# Patient Record
Sex: Female | Born: 1980 | Race: White | Hispanic: Yes | Marital: Single | Smoking: Never smoker
Health system: Southern US, Community
[De-identification: ages and names within clinical notes are randomized; demographics above are authoritative.]

## PROBLEM LIST (undated history)

## (undated) DIAGNOSIS — G510 Bell's palsy: Secondary | ICD-10-CM

## (undated) DIAGNOSIS — Z9109 Other allergy status, other than to drugs and biological substances: Secondary | ICD-10-CM

## (undated) DIAGNOSIS — N979 Female infertility, unspecified: Secondary | ICD-10-CM

## (undated) HISTORY — DX: Other allergy status, other than to drugs and biological substances: Z91.09

## (undated) HISTORY — DX: Bell's palsy: G51.0

## (undated) HISTORY — PX: OTHER SURGICAL HISTORY: SHX169

## (undated) HISTORY — DX: Female infertility, unspecified: N97.9

---

## 2009-04-30 DIAGNOSIS — G510 Bell's palsy: Secondary | ICD-10-CM

## 2009-04-30 HISTORY — DX: Bell's palsy: G51.0

## 2010-05-05 ENCOUNTER — Emergency Department (HOSPITAL_COMMUNITY)
Admission: EM | Admit: 2010-05-05 | Discharge: 2010-05-05 | Payer: Self-pay | Source: Home / Self Care | Admitting: Emergency Medicine

## 2010-05-05 LAB — GLUCOSE, CAPILLARY: Glucose-Capillary: 88 mg/dL (ref 70–99)

## 2010-05-16 ENCOUNTER — Ambulatory Visit: Admit: 2010-05-16 | Payer: Self-pay | Admitting: Infectious Diseases

## 2011-08-15 ENCOUNTER — Encounter: Payer: Self-pay | Admitting: Family Medicine

## 2011-09-12 ENCOUNTER — Ambulatory Visit (INDEPENDENT_AMBULATORY_CARE_PROVIDER_SITE_OTHER): Payer: Self-pay | Admitting: Family Medicine

## 2011-09-12 ENCOUNTER — Encounter: Payer: Self-pay | Admitting: Family Medicine

## 2011-09-12 VITALS — BP 118/79 | HR 90 | Ht 65.5 in | Wt 141.0 lb

## 2011-09-12 DIAGNOSIS — N946 Dysmenorrhea, unspecified: Secondary | ICD-10-CM

## 2011-09-12 DIAGNOSIS — Z0000 Encounter for general adult medical examination without abnormal findings: Secondary | ICD-10-CM

## 2011-09-12 DIAGNOSIS — Z01419 Encounter for gynecological examination (general) (routine) without abnormal findings: Secondary | ICD-10-CM | POA: Insufficient documentation

## 2011-09-12 NOTE — Progress Notes (Signed)
S: Pt comes in today for new patient appointment.  She is applying for the orange card.  *Visit done with assistance of an interpreter*  Past medical, surgical, family, social histories updated as documented above.  Specific concerns today:  Trying to get pregnant, menstrual cramps  CRAMPS and ?INFERTILITY Cramping is usually 1st and 2nd day of cycle; motrin seems to help but they are very uncomfortable, just wants to make sure this can be normal.  Periods are not particularly heavy- last 4-5 days, occasionally has 1-2 clots.  Regular-- happen every 26-28 days.  Has been using no protection during sexual intercourse with her boyfriend for ~6 months.  Has been in this relationship x1 year (was using interruption method for birth control prior to 6 months ago).  Has never been on OCPs or other hormone therapy.  Neither her nor her boyfriend have any children.  She has not had any miscarriages or abdominal surgeries.  No family history of infertility.     ROS: Per HPI  History  Smoking status  . Not on file  Smokeless tobacco  . Not on file    O:  Filed Vitals:   09/12/11 0924  BP: 118/79  Pulse: 90    Gen: NAD, spanish speaking CV: RRR, no murmur Pulm: CTA bilat, no wheezes or crackles Abd: soft, NT Ext: Warm, no chronic skin changes, no edema   A/P: 31 y.o. female presents as new pt -See problem list -f/u once orange card established

## 2011-09-12 NOTE — Assessment & Plan Note (Signed)
Trying to get pregnant x6 months, will wait to do any baseline testing or pap until orange card approval.

## 2011-09-12 NOTE — Assessment & Plan Note (Signed)
Rec'd motrin, tylenol, hot showers, heating pad.  Only lasts 1-2 days at onset of cycle.  No red flags.

## 2011-09-12 NOTE — Patient Instructions (Signed)
Return once you have met with Debbie Campos

## 2011-11-02 ENCOUNTER — Ambulatory Visit (INDEPENDENT_AMBULATORY_CARE_PROVIDER_SITE_OTHER): Payer: Self-pay | Admitting: Sports Medicine

## 2011-11-02 ENCOUNTER — Encounter: Payer: Self-pay | Admitting: Family Medicine

## 2011-11-02 VITALS — BP 122/79 | HR 79 | Ht 65.5 in | Wt 138.4 lb

## 2011-11-02 DIAGNOSIS — M999 Biomechanical lesion, unspecified: Secondary | ICD-10-CM

## 2011-11-02 DIAGNOSIS — M533 Sacrococcygeal disorders, not elsewhere classified: Secondary | ICD-10-CM

## 2011-11-02 DIAGNOSIS — N946 Dysmenorrhea, unspecified: Secondary | ICD-10-CM

## 2011-11-02 MED ORDER — CYCLOBENZAPRINE HCL 10 MG PO TABS: 10.0000 mg | ORAL_TABLET | Freq: Every evening | ORAL | Status: AC | PRN

## 2011-11-02 MED ORDER — NAPROXEN 500 MG PO TABS: 500.0000 mg | ORAL_TABLET | Freq: Two times a day (BID) | ORAL | Status: AC

## 2011-11-02 NOTE — Patient Instructions (Addendum)
It was nice to meet you today.  You can take Aleeve twice daily to help with your back pain.   I have also sent in a prescription for Flexeril to be taken at night to help with your pain.  Continue using your icy hot patch as needed.  Follow up with Dr. Fara Boros later next week for a complete physical and for PAP smear.  Please see your dentist to have your tooth better assessed.  If you have fevers, chills, or vomiting please call our clinic to be re-evaluated sooner.  Me dio mucho gusto conocerte hoy Usted puede tomar [employer] veces al dia para ayudarla con su dolor de espalda. Ya envie Flexeril a la farmacia para que la tome por la noche par [employer] ayude con su dolor. Continue usando el parche de [employer] cada que lo necesite. Venga a su cita con la doctora Dr. Fara Boros para el papanicolau Vea el dentista para que le revise el diente. You tu tienes fiebre, escalofrios, o vomito, para que la revisen de [place]

## 2011-11-02 NOTE — Progress Notes (Signed)
Interpreter Marimar Suber Namihira for Dr Rigby 

## 2011-11-03 DIAGNOSIS — M999 Biomechanical lesion, unspecified: Secondary | ICD-10-CM | POA: Insufficient documentation

## 2011-11-03 DIAGNOSIS — M533 Sacrococcygeal disorders, not elsewhere classified: Secondary | ICD-10-CM | POA: Insufficient documentation

## 2011-11-03 NOTE — Assessment & Plan Note (Addendum)
Prescription for Naprosyn to followup with Dr. Fara Boros.  No red flags

## 2011-11-03 NOTE — Assessment & Plan Note (Signed)
Patient prescribed, naproxen and Flexeril.  As significant lumbar and pelvic girdle spasms.  Likely associated with premenstrual syndrome.  Continue medical as well as exercise treatment.  Exercises provided

## 2011-11-03 NOTE — Progress Notes (Signed)
  [facility]  Patient name: Debbie Campos MRN [id number]  Date of birth: 04-Aug-1980  Interpreter present for this encounter  CC & HPI  Jacqeline Broers is a 31 y.o. female presenting today for evaluation of R hip and R lumbar pain.  Location  right hip and right SI, region   Onset  partially 8 years ago, has been having intermittent problems at that time.  Acutely began approximately 2-3 days ago   Character  tightness crampiness.    Severity  6/10    Temporal  worse whenever she is closer to her menstrual period.  Which is approximated is awake   Alleviating  ICY hot pad typically helps.  This time is different has not relieved as much   Aggrivating  any type of movement    Has been seen by Dr. Fara Boros, and is going to be seeing her shortly for Pap smear as well as further PCP care.  Has had dysmenorrhea for many years, as well as premenstrual and menstrual cramps.  This is typically associated with this current back pain.  She is having harvested much more severe.    ROS  No other problems reported.  No fevers, no chills.  Patient is tearful and reports she is been very emotional especially her around the time of her period.  Is fearful of medical diagnoses, because she has had a significant Bell's palsy that she gets concerned about on occasion  Pertinent History Reviewed  Medical & Surgical Hx:  Reviewed: Significant for menstrual cramps, Bell's palsy Medications: Reviewed & Updated - see associated section Social History: Reviewed - Significant for nonsmoker  Objective Findings  Vitals:  Filed Vitals:   11/02/11 1403  BP: 122/79  Pulse: 79    PE: GENERAL:  Adult hispanic female.  Examined in [facility].  In moderate discomfort; norespiratory distress.   PSYCH: Alert and appropriately interactive; Insight:Good   H&N: AT/Home, MMM, no scleral icterus, EOMi THORAX: HEART: RRR, S1/S2 heard, no murmur LUNGS: CTA B, no wheezes, no  crackles EXTREMITIES: Moves all 4 extremities spontaneously, warm well perfused, 0 edema, bilateral DP and PT pulses 2/4.  Neurologic: LE DTRs 2+ out of 4, lower extremity myotomes intact 5 out of 5 diffusely OSTEOPATHIC:   Standing Flexion Test: right            Seated Flexion/ASIS CompressionTest: right Leg Length screening: R short leg LUMBAR   right upper pole L5, L4, tender points   L4 rotated right side, side bent right    SACRUM   left on left   PELVIS   right anterior

## 2011-11-03 NOTE — Assessment & Plan Note (Signed)
OMT performed to lumbar, sacral, pelvis.  Patient with significant improvement prior to completion of exam and treatment.  Patient likely benefit from further osteopathic treatment especially for premenstrual and her lumbar muscular spasm

## 2011-11-06 ENCOUNTER — Encounter: Payer: Self-pay | Admitting: Family Medicine

## 2011-11-16 ENCOUNTER — Encounter: Payer: Self-pay | Admitting: Family Medicine

## 2011-11-21 ENCOUNTER — Ambulatory Visit (INDEPENDENT_AMBULATORY_CARE_PROVIDER_SITE_OTHER): Payer: Self-pay | Admitting: Family Medicine

## 2011-11-21 ENCOUNTER — Encounter: Payer: Self-pay | Admitting: Family Medicine

## 2011-11-21 ENCOUNTER — Other Ambulatory Visit (HOSPITAL_COMMUNITY)
Admission: RE | Admit: 2011-11-21 | Discharge: 2011-11-21 | Disposition: A | Discharge status: Home / Self Care | Payer: Self-pay | Source: Ambulatory Visit | Attending: Family Medicine | Admitting: Family Medicine

## 2011-11-21 VITALS — BP 123/80 | HR 96 | Ht 65.5 in | Wt 136.0 lb

## 2011-11-21 DIAGNOSIS — Z0000 Encounter for general adult medical examination without abnormal findings: Secondary | ICD-10-CM

## 2011-11-21 DIAGNOSIS — N979 Female infertility, unspecified: Secondary | ICD-10-CM

## 2011-11-21 DIAGNOSIS — Z01419 Encounter for gynecological examination (general) (routine) without abnormal findings: Secondary | ICD-10-CM | POA: Insufficient documentation

## 2011-11-21 DIAGNOSIS — Z124 Encounter for screening for malignant neoplasm of cervix: Secondary | ICD-10-CM

## 2011-11-21 LAB — CBC
HCT: 37 % (ref 36.0–46.0)
Hemoglobin: 12.1 g/dL (ref 12.0–15.0)
MCH: 27.1 pg (ref 26.0–34.0)
MCHC: 32.7 g/dL (ref 30.0–36.0)
MCV: 83 fL (ref 78.0–100.0)
Platelets: 214 10*3/uL (ref 150–400)
RBC: 4.46 MIL/uL (ref 3.87–5.11)
RDW: 15.3 % (ref 11.5–15.5)
WBC: 6.8 10*3/uL (ref 4.0–10.5)

## 2011-11-21 NOTE — Assessment & Plan Note (Signed)
Pap done today.  TSH and CBC also done for infertility w/u. Pt due for Tdap as well.

## 2011-11-21 NOTE — Progress Notes (Signed)
Interpreter Debbie Campos for [facility]

## 2011-11-21 NOTE — Progress Notes (Signed)
  Subjective:     Debbie Campos is a 31 y.o. female and is here for a comprehensive physical exam. The patient reports no problems.  Thinks she has had a pap smear in the last 3 years, but would like one today.  Infertility: has not been able to get pregnant.  Has been having unprotected sex with boyfriend for 6 months. Neither has had children.  Pt reports regular periods, every 26-27 days lasting for 4-5 days with normal amount of bleeding (no clots, not soaking through pads). Does have significant cramping the first 1-2 days of cycle.  Has used ovulation strips once- was positive for 2 days.  Is only able to have intercourse on the weekends.  LMP 11/05/10, thinks she ovulated 11 days after first day of period.   History   Social History  . Marital Status: Married    Spouse Name: N/A    Number of Children: N/A  . Years of Education: N/A   Occupational History  . Not on file.   Social History Main Topics  . Smoking status: Never Smoker   . Smokeless tobacco: Not on file  . Alcohol Use: No  . Drug Use: No  . Sexually Active: Yes    Birth Control/ Protection: None     trying to get pregnant   Other Topics Concern  . Not on file   Social History Narrative   Works part timeHeard about [place] from a friendPrimary language is spanish    Health Maintenance  Topic Date Due  . Pap Smear  10/07/1996  . Tetanus/tdap  10/07/1997  . Influenza Vaccine  01/29/2012    The following portions of the patient's history were reviewed and updated as appropriate: allergies, current medications, past family history, past medical history, past social history, past surgical history and problem list.  Review of Systems A comprehensive review of systems was negative.   Objective:    BP 123/80  Pulse 96  Ht 5' 5.5" (1.664 m)  Wt 136 lb (61.689 kg)  BMI 22.29 kg/m2  LMP 10/09/2011 General appearance: alert, cooperative, appears stated age and no distress Head: Normocephalic, without  obvious abnormality, atraumatic Eyes: conjunctivae/corneas clear. PERRL, EOM's intact. Fundi benign. Ears: normal TM's and external ear canals both ears Nose: Nares normal. Septum midline. Mucosa normal. No drainage or sinus tenderness. Throat: lips, mucosa, and tongue normal; teeth and gums normal Neck: no adenopathy, supple, symmetrical, trachea midline and thyroid not enlarged, symmetric, no tenderness/mass/nodules Lungs: clear to auscultation bilaterally Heart: regular rate and rhythm, S1, S2 normal, no murmur, click, rub or gallop Abdomen: soft, non-tender; bowel sounds normal; no masses,  no organomegaly Pelvic: cervix normal in appearance, external genitalia normal, no adnexal masses or tenderness, no cervical motion tenderness, rectovaginal septum normal, uterus normal size, shape, and consistency and vagina normal without discharge Extremities: extremities normal, atraumatic, no cyanosis or edema Skin: Skin color, texture, turgor normal. No rashes or lesions Neurologic: Grossly normal    Assessment:    Healthy female exam. Concerns about infertility.     Plan:     See After Visit Summary for Counseling Recommendations

## 2011-11-21 NOTE — Assessment & Plan Note (Signed)
Technically since pt is <33 yo, does not qualify for dx of infertility (if <33yo, >1 year of non-conception; if >33yo, only need 6 months).  Most likely cause is lack of ability to have intercourse when ovulating due to boyfriend working out of town and only being home on weekends.  Pt has had + ovulatory test strips.  Encouraged her to continue these.  Next step would be semen analysis of female.  Patient with discuss with partner about his willingness to undergo this process.

## 2011-11-21 NOTE — Patient Instructions (Signed)
Gusto en verla hoy, Le hicimos su papanicolau. Le checamos la sangre paa buscar la razon porque no se embaraza. Creo que Zenaida Niece a Nature conservation officer. Lo siguiente es hablar con su pareja sobre un analisis de semen. Esto es dar un [place] de su semen para Public librarian. Siga unsando las tiras para ver si sigue ovulando, y para que sepa cuando es el mejor momento de Child psychotherapist. si decide hacerlo aviseme y yo le ayudo a coordiarlo.  Infertilidad (Infertility) QU ES LA INFERTILIDAD?  La infertilidad normalmente se define como la incapacidad para quedar embarazada luego de un ao de relaciones sexuales regulares sin la utilizacin de mtodos anticonceptivos. O la incapacidad de llevar a trmino Chartered loss adjuster y Warehouse manager el beb. La tasa de infertilidad en los [place] es de alrededor del 10%. El [address] es el resultado de una cadena de sucesos. La mujer debe liberar el vulo de uno de sus ovarios (ovulacin). El vulo debe fertilizarse con el esperma. Luego viaja a travs de las trompas de Falopio hacia el tero (matriz), donde se une a la pared del tero y crece. El hombre debe tener suficiente esperma y el esperma debe unirse con el vulo (fertilizar) en el momento justo. El vulo fertilizado debe luego unirse al interior del tero. Esto parece simple, pero pueden ocurrir [employer] cosas que evitan que el embarazo se produzca.  DE QUIN ES EL PROBLEMA?  Un 20% de los casos de infertilidad se deben a problemas del hombres (factores masculinos) y un 65% se debe a problemas de la mujer (factores femeninos). Otras causas pueden ser [place] combinacin de factores masculinos y femeninos o a causas desconocidas.  CULES SON LAS CAUSAS DE LA INFERTILIDAD EN EL HOMBRE?  La infertilidad en el hombre a menudo se debe a problemas para producir el esperma o hacer que el mismo llegue al vulo. Los problemas de esperma pueden existir desde el nacimiento o desarrollarse ms tarde debido a enfermedades o lesiones. Algunos  hombres no producen esperma, o producen muy poco (oligospermia). Entre otros problemas se incluyen:  Disfuncin sexual   Problemas hormonales o endocrinos.   La edad. La fertilidad del hombre disminuye con la edad, pero no tan temprano como la femenina.   Infecciones.   Problemas congnitos Defectos de nacimiento, como ausencia de los tubos que transportan el esperma (conductos deferentes).   Problemas genticos o de cromosomas.   Problemas de anticuerpos antiesperma.   Eyaculacin retrgrada (el esperma va hacia la vejiga).   Varicoceles, espermatoceles o tumores en los testculos.   El estilo de vida puede influir en el nmero y la calidad del esperma del hombre.   El alcohol y las drogas pueden reducir temporalmente la calidad del esperma.   Las toxinas [address], como los pesticidas y el plomo, pueden ocasionar algunos casos de infertilidad en hombres.  CULES SON LAS CAUSAS DE LA INFERTILIDAD EN LA MUJER?   La infertilidad en las mujeres est causada mayormente por problemas con la ovulacin. Sin ovulacin, el vulo no puede fertilizarse.   Seales de problemas de ovulacin son perodos menstruales irregulares o ningn perodo.   Factores simples del estilo de vida, como el estrs, la dieta, o el entrenamiento deportivo, pueden afectar el balance hormonal de Medical laboratory scientific officer.   La edad. La fertilidad comienza a IT consultant alrededor de los 30 aos y [place] a Glass blower/designer de los 37.   Mucho menos a menudo, un desequilibrio hormonal por un problema mdico serio como un tumor en la glndula  pituitaria, tiroides u otra enfermedad mdica crnica pueden ocasionar problemas de ovulacin.   Infecciones plvicas.   Sndrome de ovarios poliqusticos (aumento de hormonas masculinas, incapacidad de ovular).   Consumo de alcohol o drogas.   Toxinas ambientales, radiacin, pesticidas y ciertos qumicos.   La edad es un factor importante en la infertilidad femenina.   La  capacidad de los ovarios de una mujer para producir vulos disminuye con la edad, especialmente despus de los 35 aos. Alrededor de un tercio de las parejas en las que la mujer tiene ms de 35 aos tendr problemas de infertilidad.   En el momento en el que alcanza la [place], cuando su perodo menstrual se detiene, la mujer ya no puede producir vulos ni quedar embarazada.   Otros problemas tambin pueden llevar a la infertilidad en las mujeres. Si las trompas de [employer] estn bloqueadas en [employer] extremos, el vulo no puede pasar a travs de los tubos [place]. Tejido cicatrizal (adhesiones) en la pelvis que pueden obstruir las trompas. Esto puede dar como resultado una enfermedad inflamatoria plvica, endometriosis o una ciruga por embarazo ectpico (en la que el vulo fertilizado se ha implantado fuera del tero) o cualquier ciruga plvica o abdominal que ocasione adherencias.   Tumores fibroides o plipos en el tero.   Anormalidades congnitas (de nacimiento) del tero.   Infecciones en el cuello del tero (cervicitis).   Estenosis cervical (estrechamiento).   Mucosidad cervical anormal.   Sndrome poliqustico de los ovarios.   Tener relaciones sexuales muy seguidas (todos [place] o 4 a 5 veces por semana).   Obesidad.   Anorexia.   Dficit nutricional.   Mucho ejercicio, con prdida de grasa corporal.   DES. Su madre ha recibido la hormona dietilstilbesterol cuando estaba embarazada de usted.  CMO SE ANALIZA LA INFERTILIDAD?  Si ha estado tratando de quedar embarazada sin xito, podra querer buscar ayuda mdica. No debera esperar un ao de intentos sin xito antes de buscar ayuda profesional si:  Tiene mas de 35 aos de edad   Tiene razones para creer que podra tener problemas de fertilidad.  Un examen mdico determinar las causas de infertilidad de la pareja, Normalmente el proceso comienza con:  Exmenes fsicos   Historias clnicas de ambos  miembros de la pareja.   Historiales sexuales de ambos miembros de la pareja.  Si no hay un problema evidente, como relaciones sexuales en tiempos inadecuados o ausencia de ovulacin, se necesitarn Academic librarian.   Para el hombre, los anlisis normalmente comienzan con pruebas de semen para observar:   La cantidad de esperma.   La forma del esperma.   El movimiento del esperma.   Realizar un historial clnico y quirrgico completo.   Examen fsico.   Control de infecciones en los rganos reproductivos.  Podrn realizarle anlisis hormonales.   Para la mujer, el primer paso del anlisis es saber si ovula cada mes. Hay diferentes modos de Designer, industrial/product. Por ejemplo, puede llevar un registro de los cambios en la temperatura corporal por la maana y la textura de la mucosidad cervical. Otra herramienta es un kit de prueba de ovulacin casero, que puede comprarse en la farmacia.   Tambin pueden realizarse controles de ovulacin en el consultorio mdico, mediante anlisis de sangre para observar los niveles de hormonas o pruebas de [employer] ovarios. Si la mujer est ovulando, se necesitarn realizar ms exmenes. Algunas femeninas comunes incluyen:   Histerosalpingografa: Se realizan rayos x de  las trompas de [place]opio y el tero con una inyeccin de [place]. Mostrar si las trompas estn abiertas y la forma del tero.   Laparoscopa: Un anlisis de las trompas y otros rganos femeninos para Copywriter, advertising. Se utiliza un tubo con iluminacin llamado laparoscopio para observar el interior del abdomen.   Biopsia endometrial: Se toma una muestra del tejido del tero Film/video editor del perodo menstrual, para ver si el tejido le indica si est ovulando.   Prueba de ultrasonido transvaginal: Examina los rganos femeninos.   Histeroscopa: Utiliza un tubo con iluminacin para examinar el cuello del tero y el tero y ver si hay anormalidades dentro del mismo.   TRATAMIENTO Dependiendo de los [employer], se sugerirn IT sales professional. El tratamiento depende de la causa. Entre el 85 y el 90% de los casos de infertilidad se tratan con drogas o [place].   Hay varias drogas para la fertilidad que pueden utilizarse para las mujeres con problemas de ovulacin. Es importante hablar con el profesional que la asiste sobre la droga a Chemical engineer. Deber comprender los beneficios y efectos colaterales de las drogas. Segn el tipo de droga para la fertilidad y la dosis [place], algunas mujeres podran tener embarazos mltiples (mellizos).   De ser [employer], se puede realizar una ciruga para reparar daos en los ovarios de la [place], trompas de [place], el cuello del tero o el tero.   Tratamiento quirrgico o mdico para la endometriosis o el sndrome de ovario poliqustico. A veces, los problemas de fertilidad en el hombre pueden corregirse con medicamentos o [place].   Inseminacin intrauterina, IUI, de esperma en concordancia con la ovulacin.   Cambios en el estilo de vida, si esta es la causa (prdida de [place], aumento de ejercicios, dejar de fumar, beber excesivamente o tomar drogas ilegales).   Otros tipos de ciruga:   Extirpar tumores por dentro o sobre el tero.   Eliminar tejido cicatrizal de dentro del tero.   Arreglar trompas obstruidas.   Eliminar tejido cicatrizal en la pelvis y alrededor de los rganos femeninos.  QU ES LA TECNOLOGA DE REPRODUCCIN ASISTIDA (ART)?  La tecnologa de reproduccin asistida (ART) utiliza mtodos especiales para ayudar a parejas infrtiles. En esta tcnica se manipula tanto el vulo de la mujer como el esperma del hombre. El xito depende de muchos factores. La ART puede ser cara y [address]. Pero ha hecho posible que muchas parejas tengan hijos que de otra manera no hubieran podido. A continuacin se enumeran algunos mtodos:  Fertilizacin. In Vitro (FIV). In Vitro (IVF) es  un procedimiento que se hizo famoso con el nacimiento en 1978 de [place], el primer "beb de probeta" del mundo. Se utiliza cuando las trompas de [employer] de la mujer estn bloqueadas o cuando el hombre tiene poco esperma. Se utiliza una droga para estimular los ovarios y producir mltiples vulos. Una vez maduros, los vulos se retiran y se Industrial/product designer en una placa de cultivo con el esperma del hombre para la fertilizacin. Luego de aproximadamente 40 horas, los vulos se examinan para ver si han sido fertilizados por el esperma y se han dividido en clulas. Esos vulos fertilizados (embriones) se colocan luego en el tero de la mujer. Esto evita el paso por las trompas de [place].   La transferencia intrafalopiana de gametas (GIFT) es similar al IVF, pero se utiliza cuando la mujer tiene al menos una trompa de falopio normal. Se colocan de tres a cinco vulos en la trompa  de Falopio, junto con el esperma del hombre, para que la fertilizacin se realice dentro del cuerpo de Architectural technologist.   La transferencia intrafalopiana de cigotos (ZIFT), tambin se denomina transferencia embrionaria, y [place] el IVF con el GIFT. Los vulos retirados de los ovarios de la mujer se Land laboratorio y se Industrial/product designer en las trompas de [employer] en lugar de en el tero.   Los procedimientos de ART a menudo suponen la utilizacin de donantes de vulos (de [place] mujer) o de embriones congelados previamente. Los donantes de vulos pueden utilizarse si la mujer tiene [employer] ovarios o posee una enfermedad gentica que podra transmitirla al beb.   Cuando se realiza una ART hay mayor riesgo de embarazos mltiples, mellizos, trillizos o ms.   La inyeccin de esperma intracitoplasmtica es un procedimiento que inyecta un espermatozoide en el vulo para fertilizarlo.   El trasplante embrionario es un procedimiento que comienza con un embrin que se ha desarrollado en un medio especial (solucin qumica) preparado para  mantener el embrin vivo por 2 a 5 das, y Conservation officer, historic buildings.  En los [employer] que no puede encontrarse la causa y Firefighter no se produce, podr considerarse la adopcin. Document Released: 05/06/2007 Document Revised: 04/05/2011 [facility] Patient Information 2012 [place], [place].

## 2011-11-22 ENCOUNTER — Encounter: Payer: Self-pay | Admitting: Family Medicine

## 2011-11-22 LAB — TSH: TSH: 1.479 u[IU]/mL (ref 0.350–4.500)

## 2011-11-26 ENCOUNTER — Other Ambulatory Visit (HOSPITAL_COMMUNITY)
Admission: RE | Admit: 2011-11-26 | Discharge: 2011-11-26 | Disposition: A | Discharge status: Home / Self Care | Payer: Self-pay | Source: Ambulatory Visit | Attending: Family Medicine | Admitting: Family Medicine

## 2011-11-26 ENCOUNTER — Ambulatory Visit (INDEPENDENT_AMBULATORY_CARE_PROVIDER_SITE_OTHER): Payer: Self-pay | Admitting: Family Medicine

## 2011-11-26 VITALS — BP 107/70 | HR 92 | Ht 65.5 in | Wt 137.0 lb

## 2011-11-26 DIAGNOSIS — N898 Other specified noninflammatory disorders of vagina: Secondary | ICD-10-CM | POA: Insufficient documentation

## 2011-11-26 DIAGNOSIS — Z113 Encounter for screening for infections with a predominantly sexual mode of transmission: Secondary | ICD-10-CM | POA: Insufficient documentation

## 2011-11-26 DIAGNOSIS — L293 Anogenital pruritus, unspecified: Secondary | ICD-10-CM

## 2011-11-26 LAB — POCT WET PREP (WET MOUNT): Clue Cells Wet Prep Whiff POC: NEGATIVE

## 2011-11-26 MED ORDER — CLOTRIMAZOLE 1 % VA CREA: 1.0000 | TOPICAL_CREAM | Freq: Two times a day (BID) | VAGINAL | Status: AC

## 2011-11-26 NOTE — Assessment & Plan Note (Signed)
Although wet prep negative, vaginal/vulvar erythema and irritation with thick white discharge consistent with yeast infection.  Will try clotrimazole cream (patient prefers cream versus oral medication) for 1 week.  Follow-up if persistent symptoms after 1 week.

## 2011-11-26 NOTE — Progress Notes (Signed)
  Subjective:    Patient ID: Debbie Campos, female    DOB: June 10, 1980, 31 y.o.   MRN: [id number]  HPI Work-in: vaginal discharge and irritation for 1 week; pain with intercourse The course is unchanged.  She is not taking medication for these symptoms. She denies fevers, nausea, dysuria, back pain.   Review of Systems Per HPI.     Objective:   Physical Exam GEN: NAD; well-nourished, -appearing CV: RRR PULM: NI WOB ABD: NABS, soft, NT, ND EXT: no edema GU: vulva/vagina with erythema and tenderness; cervix normal; thick white vaginal discharge with foul-smelling odor; ?cervical motion tenderness BACK: no CVA tenderness    Assessment & Plan:

## 2011-11-26 NOTE — Patient Instructions (Addendum)
Es posible que tiene una infeccion de hongo.  Use la crema contra el hongo dos veces diario para siete dias.  Si todavia tiene sintomas despues de [employer], regrese a la clinica por favor.

## 2011-11-28 ENCOUNTER — Encounter: Payer: Self-pay | Admitting: Family Medicine

## 2012-02-27 ENCOUNTER — Ambulatory Visit (INDEPENDENT_AMBULATORY_CARE_PROVIDER_SITE_OTHER): Payer: Self-pay | Admitting: Family Medicine

## 2012-02-27 ENCOUNTER — Encounter: Payer: Self-pay | Admitting: Family Medicine

## 2012-02-27 VITALS — BP 117/79 | HR 87 | Wt 136.0 lb

## 2012-02-27 DIAGNOSIS — F411 Generalized anxiety disorder: Secondary | ICD-10-CM

## 2012-02-27 DIAGNOSIS — F418 Other specified anxiety disorders: Secondary | ICD-10-CM

## 2012-02-27 MED ORDER — LORAZEPAM 1 MG PO TABS: 0.5000 mg | ORAL_TABLET | Freq: Two times a day (BID) | ORAL | Status: DC | PRN

## 2012-02-27 NOTE — Assessment & Plan Note (Addendum)
Will try low dose ativan (0.5-1mg ) BID PRN for situational anxiety (precepted with Dr. Mauricio Po).  Pt reports she usually gets better on her own after a few weeks to month.  Should do PHQ-9 at next visit to evaluate for underlying depression.  Consider starting SSRI if remains an issue. F/u 1-2 weeks for recheck.

## 2012-02-27 NOTE — Progress Notes (Addendum)
S: Pt comes in today for work in visit for neck pain and anxiety.  Patient reports feeling very nervous and scared for the past 8 days, since her uncle diet of a stroke.  Pt reports that she is just fearful in general, but does feel her heart racing when she goes to unlock her front door when she gets home.  She also gets anxious when cooking, she feels like there is someone behind her.  She had an episode 2 days ago where an oil and vinegar container moved across the counter top on its own when she and her sister were cleaning up after making a meal.  She has felt like this before, she usually becomes very scared every time someone passes.  The last time that this happened, she had anxiety for ~1 month before it went away.  Pt was feeling completely fine, without any anxiety or fear, until her uncle's death.  The last time she had this much anxiety she unfortunately also had Bell's Palsy which she attributes to being scared, and so she is worried that the Bell's Palsy will return with the return of her fear.  She is functioning well- able to cook, clean, go out of the house, stay at home by herself, etc.  She was sleeping well until last night, which was the first night she had difficulty sleeping. She is eating and drinking well.  No N/V. No chest pain or dizziness, + occasional palpitations.  Reports thinking about her uncle a lot.  Is asking for a pill to decrease her nerves and help her sleep.   GAD score: 12  ROS: Per HPI  History  Smoking status  . Never Smoker   Smokeless tobacco  . Not on file    O:  Filed Vitals:   02/27/12 1533  BP: 117/79  Pulse: 87    Gen: NAD, appropriate, good eye contact, well dressed/groomed, not tearful, linear thinking without obvious pressured speech    A/P: 31 y.o. female p/w situational anxiety -See problem list -f/u in 1-2 weeks

## 2012-02-27 NOTE — Patient Instructions (Signed)
It was nice to see you.  I'm sorry you're having so much anxiety.  I am giving you a medicine that you can take 2 times per day while you are having this fear.  Take 1/2 to 1 tablet.  Do not drink any alcohol while taking this medicine.   Do not drive after taking this medicine until you have taken it a few times.  Come back in 1-2 weeks so we can see how you are doing.     Ansiedad y crisis de [place] (Anxiety and Panic Attacks) El profesional que lo asiste le ha informado que usted padece ansiedad o crisis de [place]. Este trastorno puede presentarse de [employer]. La mayor parte de las veces las crisis aparecen de modo repentino y sin aviso. Se producen en cualquier momento del da, incluso durante el sueo, y en cualquier etapa de la vida. Pueden ser muy intensas e inexplicadas. Aunque un ataque de pnico puede ser muy atemorizante, no produce daos fsicos. Algunas veces se desconoce la causa de la ansiedad. La ansiedad es un mecanismo protector del organismo en su respuesta de lucha o escape. Muchas de estas situaciones de percepcin de peligro son en realidad situaciones no fsicas (como la ansiedad de perder el [place]). CAUSAS Las causas de la ansiedad o de un ataque de pnico pueden ser [place]. Los ataques de pnico pueden ocurrir en personas sanas en una serie de circunstancias. Es posible que haya una causa gentica de los ataques de pnico. Algunos medicamentos pueden provocar ansiedad como efecto secundario. SNTOMAS Algunas de las sensaciones ms comunes son:  Terror intenso.   Vahdos, desfallecimiento.   Golpes de fro y Airline pilot.   Temor a Estate manager/land agent.   Sentimiento de irrealidad.   Sudoracin.   Temblores.   Dolor en el pecho y latidos irregulares (palpitaciones).   Sensaciones de [employer] o sofocos.   Sentimiento de peligro inminente y de que la muerte est prxima.   Hormigueo en las extremidades que puede venir de la respiracin agitada.   Alteracin de la  realidad (desrealizacin).   Sentirse separado de uno mismo (despersonalizacin).  Estos son los sntomas (problemas) ms comunes y pueden combinarse para presentar la crisis de [place].  DIAGNSTICO La evaluacin que realice el profesional depender del tipo de sntomas que est experimentando. El diagnstico de la ansiedad o de ataque de pnico se realiza cuando no se encuentra ninguna enfermedad fsica que pueda determinarse como la causa de los sntomas. TRATAMIENTO El tratamiento para prevenir la ansiedad y los ataques de pnico incluye:  Automotive engineer las circunstancias que causen ansiedad.   Reaseguro y relajacin.   Ejercicio regular.   Terapias de relajacin, como yoga.   Psicoterapia con un psiquiatra o terapeuta.   Evitar la cafena, el alcohol y las drogas [address]   Medicamentos de prescripcin.  SOLICITE ATENCIN MDICA DE INMEDIATO SI:  Usted experimenta sntomas de ataque de pnico que son distintos a sus sntomas usuales.   Tiene cualquier sntoma que empeora o lo preocupa.  Document Released: 04/16/2005 Document Revised: 04/05/2011 [facility] Patient Information 2012 [place], [place].

## 2012-02-27 NOTE — Progress Notes (Signed)
Interpreter Yacoub Diltz Namihira for Dr Mc Gill 

## 2012-04-03 ENCOUNTER — Encounter: Payer: Self-pay | Admitting: Family Medicine

## 2012-04-03 ENCOUNTER — Ambulatory Visit (INDEPENDENT_AMBULATORY_CARE_PROVIDER_SITE_OTHER): Payer: No Typology Code available for payment source | Admitting: Family Medicine

## 2012-04-03 VITALS — BP 115/72 | HR 100 | Ht 65.5 in | Wt 139.6 lb

## 2012-04-03 DIAGNOSIS — N979 Female infertility, unspecified: Secondary | ICD-10-CM

## 2012-04-03 DIAGNOSIS — F411 Generalized anxiety disorder: Secondary | ICD-10-CM

## 2012-04-03 DIAGNOSIS — F418 Other specified anxiety disorders: Secondary | ICD-10-CM

## 2012-04-03 NOTE — Assessment & Plan Note (Signed)
Technically has had 1 year of unprotected intercourse without successful pregnancy, which is desired by both her and her partner.  However, frequent intercourse has only been for the past few months due to him previously having a job that caused him to be out of town during the week.  Had normal pelvic exam previously and normal TSH.  Will have partner get semen analysis.  D/w Dr. Marice Potter (on call Ob/Gyn) who was in agreement with just doing semen analysis for now.

## 2012-04-03 NOTE — Progress Notes (Signed)
S: Pt comes in today for referral to fertility clinic.  Visit done with assistance of spanish interpreter.  At this time, patient and boyfriend have been having unprotected intercourse for ~1 year without successful pregnancy.  Neither pt nor her partner have ever had any children.  At last discussion, ~6 months ago, patient and partner were only able to have intercourse on the weekends because he has a job that causes him to travel during the week.  She has had + ovulatory test strips.  She has had a normal TSH, normal pap smear, and normal physical exam.    Partner started working here for a few months.  Is having intercourse 1-2 times per day.  Still having + ovulatory test strips, but only appear to be faintly positive- usually positive for 2 days in a row.  Periods are still regular, LMP 03/19/12.    Not taking any medicines. No family history of infertility problems.   Of note, anxiety has resolved-- only needed benzo x8 days then anxiety resolved.  Feels much better, no further mood issues.    ROS: Per HPI  History  Smoking status  . Never Smoker   Smokeless tobacco  . Not on file    O:  Filed Vitals:   04/03/12 1446  BP: 115/72  Pulse: 100    Gen: NAD, pleasant   A/P: 31 y.o. female p/w desire for pregnancy -See problem list -f/u in after semen analysis

## 2012-04-03 NOTE — Patient Instructions (Signed)
It was good to see you today. I am giving you a prescription so that your partner can have a semen analysis done.   They do this at [facility]. Do not have sex for 2-7 days before having this done. Try to not have sex for a few days before you are supposed to ovulate; then have intercourse on the days that your ovulation test strips are positive.  You can bring the results of the test back to me.   Fue bueno verte hoy. Te estoy dando una receta para que su pareja no debe tener un anlisis de semen hecho. Lo hacen en el [employer]. No tenga relaciones sexuales durante 2-7 das antes de haber hecho esto. Trate de no Child psychotherapist sexuales durante unos das antes de que se supone que ovular, y Agricultural engineer relaciones sexuales en [address] que sus tiras de prueba de ovulacin son positivas.  Usted puede traer a [employer] de la prueba de [place] a m.    Infertilidad (Infertility) QU ES LA INFERTILIDAD?  La infertilidad normalmente se define como la incapacidad para quedar embarazada luego de un ao de relaciones sexuales regulares sin la utilizacin de mtodos anticonceptivos. O la incapacidad de llevar a trmino Chartered loss adjuster y Warehouse manager el beb. La tasa de infertilidad en los [place] es de alrededor del 10%. El [address] es el resultado de una cadena de sucesos. La mujer debe liberar el vulo de uno de sus ovarios (ovulacin). El vulo debe fertilizarse con el esperma. Luego viaja a travs de las trompas de Falopio hacia el tero (matriz), donde se une a la pared del tero y crece. El hombre debe tener suficiente esperma y el esperma debe unirse con el vulo (fertilizar) en el momento justo. El vulo fertilizado debe luego unirse al interior del tero. Esto parece simple, pero pueden ocurrir [employer] cosas que evitan que el embarazo se produzca.  DE QUIN ES EL PROBLEMA?  Un 20% de los casos de infertilidad se deben a problemas del hombres (factores masculinos) y un 65% se debe a  problemas de la mujer (factores femeninos). Otras causas pueden ser [place]omia Dear combinacin de factores masculinos y femeninos o a causas desconocidas.  CULES SON LAS CAUSAS DE LA INFERTILIDAD EN EL HOMBRE?  La infertilidad en el hombre a menudo se debe a problemas para producir el esperma o hacer que el mismo llegue al vulo. Los problemas de esperma pueden existir desde el nacimiento o desarrollarse ms tarde debido a enfermedades o lesiones. Algunos hombres no producen esperma, o producen muy poco (oligospermia). Entre otros problemas se incluyen:  Disfuncin sexual  Problemas hormonales o endocrinos.  La edad. La fertilidad del hombre disminuye con la edad, pero no tan temprano como la femenina.  Infecciones.  Problemas congnitos Defectos de nacimiento, como ausencia de los tubos que transportan el esperma (conductos deferentes).  Problemas genticos o de cromosomas.  Problemas de anticuerpos antiesperma.  Eyaculacin retrgrada (el esperma va hacia la vejiga).  Varicoceles, espermatoceles o tumores en los testculos.  El estilo de vida puede influir en el nmero y la calidad del esperma del hombre.  El alcohol y las drogas pueden reducir temporalmente la calidad del esperma.  Las toxinas [address], como los pesticidas y el plomo, pueden ocasionar algunos casos de infertilidad en hombres. CULES SON LAS CAUSAS DE LA INFERTILIDAD EN LA MUJER?   La infertilidad en las mujeres est causada mayormente por problemas con la ovulacin. Sin ovulacin, el vulo no puede fertilizarse.  Seales  de problemas de ovulacin son perodos menstruales irregulares o ningn perodo.  Factores simples del estilo de vida, como el estrs, la dieta, o el entrenamiento deportivo, pueden afectar el balance hormonal de Medical laboratory scientific officer.  La edad. La fertilidad comienza a IT consultant alrededor de los 30 aos y [place] a Glass blower/designer de los 37.  Mucho menos a menudo, un desequilibrio hormonal por un  problema mdico serio como un tumor en la glndula pituitaria, tiroides u otra enfermedad mdica crnica pueden ocasionar problemas de ovulacin.  Infecciones plvicas.  Sndrome de ovarios poliqusticos (aumento de hormonas masculinas, incapacidad de ovular).  Consumo de alcohol o drogas.  Toxinas ambientales, radiacin, pesticidas y ciertos qumicos.  La edad es un factor importante en la infertilidad femenina.  La capacidad de los ovarios de una mujer para producir vulos disminuye con la edad, especialmente despus de los 35 aos. Alrededor de un tercio de las parejas en las que la mujer tiene ms de 35 aos tendr problemas de infertilidad.  En el momento en el que alcanza la [place], cuando su perodo menstrual se detiene, la mujer ya no puede producir vulos ni quedar embarazada.  Otros problemas tambin pueden llevar a la infertilidad en las mujeres. Si las trompas de Nordstrom estn bloqueadas en [employer] extremos, el vulo no puede pasar a travs de los tubos [place]. Tejido cicatrizal (adhesiones) en la pelvis que pueden obstruir las trompas. Esto puede dar como resultado una enfermedad inflamatoria plvica, endometriosis o una ciruga por embarazo ectpico (en la que el vulo fertilizado se ha implantado fuera del tero) o cualquier ciruga plvica o abdominal que ocasione adherencias.  Tumores fibroides o plipos en el tero.  Anormalidades congnitas (de nacimiento) del tero.  Infecciones en el cuello del tero (cervicitis).  Estenosis cervical (estrechamiento).  Mucosidad cervical anormal.  Sndrome poliqustico de los ovarios.  Tener relaciones sexuales muy seguidas (todos [place] o 4 a 5 veces por semana).  Obesidad.  Anorexia.  Dficit nutricional.  Mucho ejercicio, con prdida de grasa corporal.  DES. Su madre ha recibido la hormona dietilstilbesterol cuando estaba embarazada de usted. CMO SE ANALIZA LA INFERTILIDAD?  Si ha estado tratando  de quedar embarazada sin xito, podra querer buscar ayuda mdica. No debera esperar un ao de intentos sin xito antes de buscar ayuda profesional si:  Tiene mas de 35 aos de edad  Tiene razones para creer que podra tener problemas de fertilidad. Un examen mdico determinar las causas de infertilidad de la pareja, Normalmente el proceso comienza con:  Exmenes fsicos  Historias clnicas de ambos miembros de la pareja.  Historiales sexuales de ambos miembros de la pareja. Si no hay un problema evidente, como relaciones sexuales en tiempos inadecuados o ausencia de ovulacin, se necesitarn Academic librarian.   Para el hombre, los anlisis normalmente comienzan con pruebas de semen para observar:  La cantidad de esperma.  La forma del esperma.  El movimiento del esperma.  Realizar un historial clnico y quirrgico completo.  Examen fsico.  Control de infecciones en los rganos reproductivos. Podrn realizarle anlisis hormonales.   Para la mujer, el primer paso del anlisis es saber si ovula cada mes. Hay diferentes modos de Designer, industrial/product. Por ejemplo, puede llevar un registro de los cambios en la temperatura corporal por la maana y la textura de la mucosidad cervical. Otra herramienta es un kit de prueba de ovulacin casero, que puede comprarse en la farmacia.  Tambin pueden realizarse controles de ovulacin  en el consultorio mdico, mediante anlisis de sangre para observar los niveles de hormonas o pruebas de [employer] ovarios. Si la mujer est ovulando, se necesitarn realizar ms exmenes. Algunas femeninas comunes incluyen:  Histerosalpingografa: Se realizan rayos x de las trompas de Falopio y el tero con una inyeccin de [place]. Mostrar si las trompas estn abiertas y la forma del tero.  Laparoscopa: Un anlisis de las trompas y otros rganos femeninos para Copywriter, advertising. Se utiliza un tubo con iluminacin llamado laparoscopio para observar  el interior del abdomen.  Biopsia endometrial: Se toma una muestra del tejido del tero Film/video editor del perodo menstrual, para ver si el tejido le indica si est ovulando.  Prueba de ultrasonido transvaginal: Examina los rganos femeninos.  Histeroscopa: Utiliza un tubo con iluminacin para examinar el cuello del tero y el tero y ver si hay anormalidades dentro del mismo. TRATAMIENTO Dependiendo de los [employer], se sugerirn IT sales professional. El tratamiento depende de la causa. Entre el 85 y el 90% de los casos de infertilidad se tratan con drogas o [place].   Hay varias drogas para la fertilidad que pueden utilizarse para las mujeres con problemas de ovulacin. Es importante hablar con el profesional que la asiste sobre la droga a Chemical engineer. Deber comprender los beneficios y efectos colaterales de las drogas. Segn el tipo de droga para la fertilidad y la dosis [place], algunas mujeres podran tener embarazos mltiples (mellizos).  De ser [employer], se puede realizar una ciruga para reparar daos en los ovarios de la [place], trompas de [place], el cuello del tero o el tero.  Tratamiento quirrgico o mdico para la endometriosis o el sndrome de ovario poliqustico. A veces, los problemas de fertilidad en el hombre pueden corregirse con medicamentos o [place].  Inseminacin intrauterina, IUI, de esperma en concordancia con la ovulacin.  Cambios en el estilo de vida, si esta es la causa (prdida de Christmas, aumento de ejercicios, dejar de fumar, beber excesivamente o tomar drogas ilegales).  Otros tipos de ciruga:  Extirpar tumores por dentro o sobre el tero.  Eliminar tejido cicatrizal de dentro del tero.  Arreglar trompas obstruidas.  Eliminar tejido cicatrizal en la pelvis y alrededor de los rganos femeninos. QU ES LA TECNOLOGA DE REPRODUCCIN ASISTIDA (ART)?  La tecnologa de reproduccin asistida (ART) utiliza mtodos especiales para ayudar a  parejas infrtiles. En esta tcnica se manipula tanto el vulo de la mujer como el esperma del hombre. El xito depende de muchos factores. La ART puede ser cara y [address]. Pero ha hecho posible que muchas parejas tengan hijos que de otra manera no hubieran podido. A continuacin se enumeran algunos mtodos:  Fertilizacin. In Vitro (FIV). In Vitro (IVF) es un procedimiento que se hizo famoso con el nacimiento en 1978 de [place], el primer "beb de probeta" del mundo. Se utiliza cuando las trompas de Nordstrom de la mujer estn bloqueadas o cuando el hombre tiene poco esperma. Se utiliza una droga para estimular los ovarios y producir mltiples vulos. Una vez maduros, los vulos se retiran y se Industrial/product designer en una placa de cultivo con el esperma del hombre para la fertilizacin. Luego de aproximadamente 40 horas, los vulos se examinan para ver si han sido fertilizados por el esperma y se han dividido en clulas. Esos vulos fertilizados (embriones) se colocan luego en el tero de la mujer. Esto evita el paso por las trompas de [place].  La transferencia intrafalopiana de gametas (GIFT) es similar  al IVF, pero se utiliza cuando la mujer tiene al menos una trompa de falopio normal. Se colocan de tres a cinco vulos en la trompa de Falopio, junto con el esperma del hombre, para que la fertilizacin se realice dentro del cuerpo de Architectural technologist.  La transferencia intrafalopiana de cigotos (ZIFT), tambin se denomina transferencia embrionaria, y [place] el IVF con el GIFT. Los vulos retirados de los ovarios de la mujer se Land laboratorio y se Industrial/product designer en las trompas de Nordstrom en lugar de en el tero.  Los procedimientos de ART a menudo suponen la utilizacin de donantes de vulos (de [place] mujer) o de embriones congelados previamente. Los donantes de vulos pueden utilizarse si la mujer tiene [employer] ovarios o posee una enfermedad gentica que podra transmitirla al beb.  Cuando  se realiza una ART hay mayor riesgo de embarazos mltiples, mellizos, trillizos o ms.  La inyeccin de esperma intracitoplasmtica es un procedimiento que inyecta un espermatozoide en el vulo para fertilizarlo.  El trasplante embrionario es un procedimiento que comienza con un embrin que se ha desarrollado en un medio especial (solucin qumica) preparado para mantener el embrin vivo por 2 a 5 das, y Conservation officer, historic buildings. En los [employer] que no puede encontrarse la causa y Firefighter no se produce, podr considerarse la adopcin. Document Released: 05/06/2007 Document Revised: 07/09/2011 [facility] Patient Information 2013 [place], [place].

## 2012-04-03 NOTE — Progress Notes (Signed)
Interpreter Ashby Dawes for Dr Fara Boros

## 2012-04-03 NOTE — Assessment & Plan Note (Signed)
Resolved after 8 days of benzo.  Feeling much better now.

## 2012-04-28 ENCOUNTER — Ambulatory Visit (INDEPENDENT_AMBULATORY_CARE_PROVIDER_SITE_OTHER): Payer: No Typology Code available for payment source | Admitting: Family Medicine

## 2012-04-28 VITALS — BP 118/75 | HR 88 | Temp 98.0°F | Ht 65.5 in | Wt 137.0 lb

## 2012-04-28 DIAGNOSIS — R51 Headache: Secondary | ICD-10-CM

## 2012-04-28 MED ORDER — KETOROLAC TROMETHAMINE 30 MG/ML IJ SOLN
30.0000 mg | Freq: Once | INTRAMUSCULAR | Status: AC
  Administered 2012-04-28: 30 mg via INTRAMUSCULAR

## 2012-04-28 NOTE — Progress Notes (Signed)
  Subjective:    Patient ID: Debbie Campos, female    DOB: 1980-12-31, 31 y.o.   MRN: [id number]  HPI  31 year old F who presents with a left sided head ache of 2 weeks duration.  HEADACHE: Location: left side Quality: shooting/stinging from front to back Duration of headache without treatment: 2 weeks Current Frequency: daily Accompanying symptoms: no nausea, no vomiting, no photophobia, no phonophobia, no lacrimation, no rhinorrhea, no worsened with activity, no neurologic symptoms Effective treatment: has not tried any Ineffective treatment: n/a History of headaches: none - does note a history of left sided facial paralysis (Bells Palsy?) History of imaging: none Known triggers: no History of headache journal: none   Review of Systems Negative unless stated in HPI     Objective:   Physical Exam BP 118/75  Pulse 88  Temp 98 F (36.7 C) (Oral)  Ht 5' 5.5" (1.664 m)  Wt 137 lb (62.143 kg)  BMI 22.45 kg/m2 Gen: Spanish speaking female, well appearing, NAD, pleasant and conversant HEENT: NCAT, PERRLA, EOMI, OP clear and moist, no lymphadenopathy, CN II-XII intact Skin: no lesions of the scalp      Assessment & Plan:  31 year old F with primary headache. Try 30 mg injection of toradol  X 1. Return in 3 days of not improved.

## 2012-05-27 ENCOUNTER — Ambulatory Visit (INDEPENDENT_AMBULATORY_CARE_PROVIDER_SITE_OTHER): Payer: Self-pay | Admitting: Gynecology

## 2012-05-27 ENCOUNTER — Encounter: Payer: Self-pay | Admitting: Gynecology

## 2012-05-27 VITALS — BP 118/70 | Ht 65.25 in | Wt 139.0 lb

## 2012-05-27 DIAGNOSIS — Z3169 Encounter for other general counseling and advice on procreation: Secondary | ICD-10-CM

## 2012-05-27 MED ORDER — DOXYCYCLINE HYCLATE 100 MG PO CAPS: 100.0000 mg | ORAL_CAPSULE | Freq: Two times a day (BID) | ORAL | Status: DC

## 2012-05-27 NOTE — Progress Notes (Signed)
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 Patient's partner has never had children with anyone else before. Patient denies any prior history of PID or any STD or pelvic surgery. She states her cycles are every 21-35 days last 5 days with lots of cramping. She is currently unemployed. Her husband is a Education administrator. Patient's last Pap smear at the urgent care was in July of 2013 which was reported to be normal. Patient denies smoking or alcohol consumption. She did have history of a left Bell's palsy in 2012. On further discussion it appears that patient has been anorgasmic.  Exam: Abdomen: Soft nontender no rebound or guarding Pelvic: Bartholin urethra Skene glands within normal limits Vagina: No lesions or discharge Cervix: No lesions or discharge Uterus: Anteverted normal size shape and consistency Adnexa: No palpable masses or tenderness Rectal exam: Not done  Assessment/plan: Primary infertility etiology? Patient will be scheduled for a hysterosalpingogram with her upcoming cycle and she will be placed on Vibramycin 100 mg twice a day for 3 days starting the day prior to procedure. We will also schedule a semen analysis after 72 hours is abstinence and both to see me in consultation the week after the above procedures. She was informed to begin taking prenatal vitamins for neural tube defect prophylaxis. All the above instructions were provided in Spanish and we'll follow accordingly.

## 2012-05-27 NOTE — Patient Instructions (Addendum)
Infertilidad (Infertility) QU ES LA INFERTILIDAD?  La infertilidad normalmente se define como la incapacidad para quedar embarazada luego de un ao de relaciones sexuales regulares sin la utilizacin de mtodos anticonceptivos. O la incapacidad de llevar a trmino Nutritional therapist y Best boy el beb. La tasa de infertilidad en los [place] es de alrededor del 10%. El embarazo es el resultado de una cadena de sucesos. La mujer debe liberar el vulo de uno de sus ovarios (ovulacin). El vulo debe fertilizarse con el esperma. Luego viaja a travs de las trompas de Falopio hacia el tero (matriz), donde se une a la pared del tero y crece. El hombre debe tener suficiente esperma y el esperma debe unirse con el vulo (fertilizar) en el momento justo. El vulo fertilizado debe luego unirse al interior del tero. Esto parece simple, pero pueden ocurrir [employer] cosas que evitan que el embarazo se produzca.  DE QUIN ES EL PROBLEMA?  Un 20% de los casos de infertilidad se deben a problemas del hombres (factores masculinos) y un 65% se debe a problemas de la mujer (factores femeninos). Otras causas pueden ser Ardelia Mems combinacin de factores masculinos y femeninos o a causas desconocidas.  CULES SON LAS CAUSAS DE [place]?  La infertilidad en el hombre a menudo se debe a problemas para producir el esperma o hacer que el mismo llegue al vulo. Los problemas de esperma pueden existir desde el nacimiento o desarrollarse ms tarde debido a enfermedades o lesiones. Algunos hombres no producen esperma, o producen muy poco (oligospermia). Entre otros problemas se incluyen:  Disfuncin sexual  Problemas hormonales o endocrinos.  La edad. La fertilidad del hombre disminuye con la edad, pero no tan temprano como la femenina.  Infecciones.  Problemas congnitos Defectos de nacimiento, como ausencia de los tubos que transportan el esperma (conductos deferentes).  Problemas genticos o de  cromosomas.  Problemas de anticuerpos antiesperma.  Eyaculacin retrgrada (el esperma va hacia la vejiga).  Varicoceles, espermatoceles o tumores en los testculos.  El estilo de vida puede influir en el nmero y la calidad del esperma del hombre.  El alcohol y las drogas pueden reducir temporalmente la calidad del esperma.  Las toxinas ambientales, como los pesticidas y el plomo, pueden ocasionar algunos casos de infertilidad en hombres. CULES SON LAS CAUSAS DE LA INFERTILIDAD EN LA MUJER?   La infertilidad en las mujeres est causada mayormente por problemas con la ovulacin. Sin ovulacin, el vulo no puede fertilizarse.  Seales de problemas de ovulacin son perodos menstruales irregulares o ningn perodo.  Factores simples del estilo de vida, como el estrs, la dieta, o el entrenamiento deportivo, pueden afectar el balance hormonal de Arts administrator.  La edad. La fertilidad comienza a Chiropractor alrededor de los 30 aos y [place] a Proofreader de los 21.  Mucho menos a menudo, un desequilibrio hormonal por un problema mdico serio como un tumor en la glndula pituitaria, tiroides u otra enfermedad mdica crnica pueden ocasionar problemas de ovulacin.  Infecciones plvicas.  Sndrome de ovarios poliqusticos (aumento de hormonas masculinas, incapacidad de ovular).  Consumo de alcohol o drogas.  Toxinas ambientales, radiacin, pesticidas y ciertos qumicos.  La edad es un factor importante en la infertilidad femenina.  La capacidad de los ovarios de una mujer para producir vulos disminuye con la edad, especialmente despus de los 18 aos. Alrededor de un tercio de las parejas en las que la mujer tiene ms de 35 aos tendr problemas de infertilidad.  En el momento en el que alcanza la [place], cuando su perodo menstrual se detiene, la mujer ya no puede producir vulos ni quedar embarazada.  Otros problemas tambin pueden llevar a la infertilidad en las  mujeres. Si las trompas de [employer] estn bloqueadas en [employer] extremos, el vulo no puede pasar a travs de los tubos [place]. Tejido cicatrizal (adhesiones) en la pelvis que pueden obstruir las trompas. Esto puede dar como resultado una enfermedad inflamatoria plvica, endometriosis o una ciruga por embarazo ectpico (en la que el vulo fertilizado se ha implantado fuera del tero) o cualquier ciruga plvica o abdominal que ocasione adherencias.  Tumores fibroides o plipos en el tero.  Anormalidades congnitas (de nacimiento) del tero.  Infecciones en el cuello del tero (cervicitis).  Estenosis cervical (estrechamiento).  Mucosidad cervical anormal.  Sndrome poliqustico de los ovarios.  Tener relaciones sexuales muy seguidas ([place] 4 a 5 veces por semana).  Obesidad.  Anorexia.  Dficit nutricional.  Mucho ejercicio, con prdida de grasa corporal.  DES. Su madre ha recibido la hormona dietilstilbesterol cuando estaba embarazada de usted. CMO SE ANALIZA LA INFERTILIDAD?  Si ha estado tratando de quedar embarazada sin xito, podra querer buscar ayuda mdica. No debera esperar un ao de intentos sin xito antes de buscar ayuda profesional si:  Tiene mas de 37 aos de edad  Tiene razones para creer que podra tener problemas de fertilidad. Un examen mdico determinar las causas de infertilidad de la pareja, Normalmente el proceso comienza con:  Exmenes fsicos  Historias clnicas de ambos miembros de la pareja.  Historiales sexuales de ambos miembros de la pareja. Si no hay un problema evidente, como relaciones sexuales en tiempos inadecuados o ausencia de ovulacin, se necesitarn Training and development officer.   Para el hombre, los anlisis normalmente comienzan con pruebas de semen para observar:  La cantidad de esperma.  La forma del esperma.  El movimiento del esperma.  Realizar un historial clnico y quirrgico completo.  Examen  fsico.  Control de infecciones en los rganos reproductivos. Podrn realizarle anlisis hormonales.   Para la mujer, el primer paso del anlisis es saber si ovula cada mes. Hay diferentes modos de Secondary school teacher. Por ejemplo, puede llevar un registro de los cambios en la temperatura corporal por la maana y la textura de la mucosidad cervical. Otra herramienta es un kit de prueba de ovulacin casero, que puede comprarse en la farmacia.  Tambin pueden realizarse controles de ovulacin en el consultorio mdico, mediante anlisis de sangre para observar los niveles de hormonas o pruebas de [employer] ovarios. Si la mujer est ovulando, se necesitarn realizar ms exmenes. Algunas femeninas comunes incluyen:  Histerosalpingografa: Se realizan rayos x de las trompas de Falopio y el tero con una inyeccin de [place]. Mostrar si las trompas estn abiertas y la forma del tero.  Laparoscopa: Un anlisis de las trompas y otros rganos femeninos para Risk manager. Se utiliza un tubo con iluminacin llamado laparoscopio para observar el interior del abdomen.  Biopsia endometrial: Se toma una muestra del tejido del tero Engineer, manufacturing systems del perodo menstrual, para ver si el tejido le indica si est ovulando.  Prueba de ultrasonido transvaginal: Examina los rganos femeninos.  Histeroscopa: Utiliza un tubo con iluminacin para examinar el cuello del tero y el tero y ver si hay anormalidades dentro del mismo. TRATAMIENTO Dependiendo de los [employer], se sugerirn Engineer, technical sales. El tratamiento depende de la causa. [employer]  47 y el 90% de los casos de infertilidad se tratan con drogas o [place].   Hay varias drogas para la fertilidad que pueden utilizarse para las mujeres con problemas de ovulacin. Es importante hablar con el profesional que la asiste sobre la droga a Risk manager. Deber comprender los beneficios y [place] drogas. Segn el  tipo de droga para la fertilidad y la dosis [place], algunas mujeres podran tener embarazos mltiples (mellizos).  De ser [employer], se puede realizar una ciruga para reparar daos en los ovarios de la [place], trompas de [place], el cuello del tero o el tero.  Tratamiento quirrgico o mdico para la endometriosis o el sndrome de ovario poliqustico. A veces, los problemas de fertilidad en el hombre pueden corregirse con medicamentos o [place].  Inseminacin intrauterina, IUI, de esperma en concordancia con la ovulacin.  Cambios en el estilo de vida, si esta es la causa (prdida de [place], aumento de ejercicios, dejar de fumar, beber excesivamente o tomar drogas ilegales).  Otros tipos de ciruga:  Extirpar tumores por dentro o sobre el tero.  Eliminar tejido cicatrizal de dentro del tero.  Arreglar trompas obstruidas.  Eliminar tejido cicatrizal en la pelvis y alrededor de los rganos femeninos. QU ES LA TECNOLOGA DE REPRODUCCIN ASISTIDA (ART)?  La tecnologa de reproduccin asistida (ART) utiliza mtodos especiales para ayudar a parejas infrtiles. En esta tcnica se manipula tanto el vulo de la mujer como el esperma del hombre. El xito depende de muchos factores. La ART puede ser cara y demandar mucho tiempo. Pero ha hecho posible que muchas parejas tengan hijos que de otra manera no hubieran podido. A continuacin se enumeran algunos mtodos:  Fertilizacin. In Vitro (FIV). In Vitro (IVF) es un procedimiento que se hizo famoso con el nacimiento en [place], el primer "beb de probeta" del mundo. Se utiliza cuando las trompas de [employer] de la mujer estn bloqueadas o cuando el hombre tiene poco esperma. Se utiliza una droga para estimular los ovarios y producir mltiples vulos. Una vez maduros, los vulos se retiran y se Copywriter, advertising en una placa de cultivo con el esperma del hombre para la fertilizacin. Luego de aproximadamente 40 horas, los vulos se examinan para ver  si han sido fertilizados por el esperma y se han dividido en clulas. Esos vulos fertilizados (embriones) se colocan luego en el tero de la mujer. Esto evita el paso por las trompas de [place].  La transferencia intrafalopiana de gametas (GIFT) es similar al IVF, pero se utiliza cuando la mujer tiene al menos una trompa de falopio normal. Se colocan de tres a cinco vulos en la trompa de Falopio, junto con el esperma del hombre, para que la fertilizacin se realice dentro del cuerpo de Retail banker.  La transferencia intrafalopiana de cigotos (ZIFT), tambin se denomina transferencia embrionaria, y [place] el IVF con el GIFT. Los vulos retirados de los ovarios de la mujer se Health visitor laboratorio y se Copywriter, advertising en las trompas de [employer] en lugar de en el tero.  Los procedimientos de ART a menudo suponen la utilizacin de donantes de vulos (de [place] mujer) o de embriones congelados previamente. Los donantes de vulos pueden utilizarse si la mujer tiene [employer] ovarios o posee una enfermedad gentica que podra transmitirla al beb.  Cuando se realiza una ART hay mayor riesgo de embarazos mltiples, mellizos, trillizos o ms.  La inyeccin de esperma intracitoplasmtica es un procedimiento que inyecta un espermatozoide en el vulo para fertilizarlo.  El trasplante embrionario es un procedimiento que comienza con un embrin que se ha desarrollado en un medio especial (solucin qumica) preparado para mantener el embrin vivo por 2 a 5 das, y Conservation officer, historic buildings. En los [employer] que no puede encontrarse la causa y Firefighter no se produce, podr considerarse la adopcin. Document Released: 05/06/2007 Document Revised: 07/09/2011 [facility] Patient Information 2013 [place], [place].  Histerosalpingografa  (Hysterosalpingography) La histerosalpingografa es un procedimiento que [place] rayos X y [place] sustancia de contraste para observar el interior del tero y las trompas de  [place]. La sustancia de contraste se [employer] tero a travs de la vagina y el cuello uterino, [place] se toman radiografas. Este procedimiento puede ayudar al mdico a diagnosticar tumores , adherencias o anormalidades estructurales en el tero. Generalmente se [place] para determinar las razones por las que una mujer no puede tener hijos (infertilidad).  INFORME A SU MDICO SOBRE:   Alergias a medicamentos o alimentos, especialmente a los frutos de mar.  Medicamentos que [place], incluyendo vitaminas, hierbas, gotas oftlmicas, medicamentos de venta libre y cremas.  Uso de corticoides (por va oral o cremas).  Problemas anteriores debido a anestsicos o a medicamentos que [employer] sensibilidad.  Antecedentes de hemorragias o cogulos sanguneos.  Cirugas plvicas previas.  Otros problemas de salud, incluyendo diabetes y problemas renales.  Posible embarazo.  Cualquier alergia al yodo o al contraste usado para tomar radiografas (sustancia de [place])..  Las infecciones plvicas recientes o enfermedades de transmisin sexual (ETS). RIESGOS Y COMPLICACIONES   Infeccin en la membrana que recubre interiormente al tero (endometritis) o en las trompas de Falopio (salpingitis).  [place]ao o perforacin del tero o las trompas de [place].  Reaccin alrgica a la sustancia de [place] para tomar la radiografa. ANTES DEL PROCEDIMIENTO   Planificar el procedimiento despus que finalice su perodo, pero antes de su prxima ovulacin. Esto ocurre por lo general [employer] 5 y 10 de su ltimo perodo. El [place] 1 es Film/video editor de su perodo.  Consulte a su mdico si debe cambiar o suspender los medicamentos que toma habitualmente.  Podr comer y beber normalmente.  Le administrarn un medicamento para relajarse (sedante) o un analgsico de venta libre para Paramedic las molestias durante el procedimiento.  Antes de comenzar el procedimiento vace la  vejiga PROCEDIMIENTO   Deber acostarse en una mesa de radiografas con los pies en los estribos.  Le colocarn en la vagina un dispositivo llamado espculo . Esto permite al mdico observar el interior de la vagina hasta el cuello del tero.  El cuello del tero se lava con un jabn especial.  Luego se pasa un tubo delgado y flexible a travs del cuello del tero hasta el tero.  En el tubo se colocar una sustancia de [place]. Conley Rolls tomarn varias radiografas a medida que el contraste pasa a travs del tero y las trompas de [place].  Despus del procedimiento se retira el tubo.  El procedimiento suele durar entre 15 y 30 minutos. DESPUS DEL PROCEDIMIENTO   La mayor parte de la sustancia de contraste se eliminar naturalmente. Podra ser necesario que use un apsito sanitario.  Podr sentir algunos clicos y Winferd Humphrey pequea prdida. Luego de 24 horas deben desaparecer.  Consulte con su mdico la fecha en que los resultados estarn disponibles. Asegrese de [employer]. Document Released: 07/09/2011 [facility] Patient Information 2013 [place], [place].   LLamar a a la oficina cuando  le [place] periodo [phone]) pedir por Margretta Sidle para que le haga la cita para el HSG, tambien analysis del semen para su esposo y cita para ver al Dr. Lily Peer a la semana despues de los estudios.Tiene que empesar el antibiotico el dia antes del estudio del hospital.

## 2012-06-13 ENCOUNTER — Telehealth: Payer: Self-pay

## 2012-06-13 ENCOUNTER — Other Ambulatory Visit: Payer: Self-pay | Admitting: Gynecology

## 2012-06-13 DIAGNOSIS — N979 Female infertility, unspecified: Secondary | ICD-10-CM

## 2012-06-13 NOTE — Telephone Encounter (Signed)
Interpretor called on behalf of patient and left message in voicemail.  She informed me that patient's menses began [facility] Feb 12 and she is ready to schedule HSG.  Order entered and I scheduled her at [facility] for next week on 06/18/12 at 8:30am and patient will need to check in at 8:00am.  Dr. Glenetta Hew had already prescribed the Vibramycin at her last visit and she will need to take that day before, day of and day after HSG.  I called patient back at number interpretor left but patient does not speak English so I was unable to relay this info to her. Unfortunately, both of our Spanish speaking staff are out today due to inclement weather and we will have to wait and call her Monday with this information.

## 2012-06-16 NOTE — Telephone Encounter (Signed)
Debbie Campos called the patient today and confirmed her LMP and gave her the appt date and time for HSG.  Debbie Campos also spoke with her regarding details of arranging semen analysis.  Also, Debbie Campos reminded patient she needs Doxycyline bid day before, day of and day after her HSG.  Dr. Glenetta Hew had ordered this last office visit but patient said she did not get it. Looking closer I see that the Rx went to print.  I called it in to her [facility] pharmacy.

## 2012-06-18 ENCOUNTER — Ambulatory Visit (HOSPITAL_COMMUNITY)
Admission: RE | Admit: 2012-06-18 | Discharge: 2012-06-18 | Disposition: A | Discharge status: Home / Self Care | Payer: Self-pay | Source: Ambulatory Visit | Attending: Gynecology | Admitting: Gynecology

## 2012-06-18 DIAGNOSIS — N979 Female infertility, unspecified: Secondary | ICD-10-CM | POA: Insufficient documentation

## 2012-06-18 MED ORDER — IOHEXOL 300 MG/ML  SOLN
10.0000 mL | Freq: Once | INTRAMUSCULAR | Status: AC | PRN
  Administered 2012-06-18: 10 mL

## 2012-06-25 ENCOUNTER — Encounter: Payer: Self-pay | Admitting: Gynecology

## 2012-06-27 ENCOUNTER — Encounter: Payer: Self-pay | Admitting: Gynecology

## 2012-06-27 ENCOUNTER — Ambulatory Visit (INDEPENDENT_AMBULATORY_CARE_PROVIDER_SITE_OTHER): Payer: Self-pay | Admitting: Gynecology

## 2012-06-27 VITALS — BP 122/78

## 2012-06-27 DIAGNOSIS — N469 Male infertility, unspecified: Secondary | ICD-10-CM | POA: Insufficient documentation

## 2012-06-27 MED ORDER — CLOMIPHENE CITRATE 50 MG PO TABS: ORAL_TABLET | ORAL | Status: DC

## 2012-06-27 NOTE — Progress Notes (Signed)
Patient with primary infertility presented to the office today to discuss a result of the recent hysterosalpingogram as well as her husband semen analysis. Patient was seen the office in January 28 whereby she had stated that she has had unprotected intercourse for 2 years.Patient denies any prior history of PID or any STD or pelvic surgery. She states her cycles are every 21-35 days last 5 days with lots of cramping.  Hysterosalpingogram report as follows:  Findings: The endometrial cavity of the uterus is normal in  contour and appearance.  Contrast filling of both fallopian tubes is seen, and both tubes  are normal in appearance. Intraperitoneal spill of contrast from  both fallopian tubes is demonstrated.  IMPRESSION:  1. Fallopian tubes are patent bilaterally.  2. Question of lower uterine segment fibroid given the contour of  the cervix.  Semen analysis: Low number of normal forms otherwise normal semen analysis  Assessment/plan: Primary infertility based on menstrual cycle history appear she may have oligomenorrhea. Patient's husband semen analysis normal with the exception of total number of normal forms was low. Questionable area the lower uterine segment if there is a fibroid or artifact I reviewed the films. Because of limited financial resources we're going to proceed with ovulation induction agent for 3 months and if patient not successful in conceiving we will do a sonohysterogram here in the office and repeat her husbands  semen analysis. We discussed the risks benefits and pros and cons of clomiphene citrate. Patient's fully aware of the risk of multiple gestation and or hyperstimulation of the ovaries. She will be started on Clomid 50 mg one tablet daily from day 5 through 9. She will utilize the ovulation predictor kit from day 12 through 16 to time or intercourse. She will return to the office mid secretory phase (day 20, 21 or 22) 4 serum progesterone level. If patient has not  conceived  in 3 months she'll return for followup. She was reminded to take her prenatal vitamins daily. All the above was provided to the patient in Spanish and written format. All questions were answered and we'll follow accordingly.

## 2012-06-27 NOTE — Patient Instructions (Signed)
Clomiphene tablets Qu es este medicamento? El CLOMIFENO es un medicamento para la fertilidad que se utiliza para aumentar la posibilidad de quedar embarazada. Se usa para estimular una ovulacin (producir un huevo maduro) adecuada durante el ciclo de la mujer. Este medicamento puede ser utilizado para otros usos; si tiene alguna pregunta consulte con su proveedor de atencin mdica o con su farmacutico. Qu le debo informar a mi profesional de la salud antes de tomar este medicamento? Necesita saber si usted presenta alguno de los siguientes problemas o situaciones: -enfermedad de la glndula suprarrenal -enfermedad vascular, trastorno de coagulacin sangunea -quiste en los ovarios -endometriosis -enfermedad heptica -carcinoma de ovario -enfermedad de la glndula pituitaria -sangrado vaginal que no ha sido evaluado -una reaccin alrgica o inusual al clomifeno, a otros medicamentos, alimentos, colorantes o conservantes -si est embarazada (no debe usar si est embarazada) -si est amamantando a un beb Cmo debo utilizar este medicamento? Tome este medicamento por va oral con un vaso de agua. Siga las instrucciones de la etiqueta del medicamento. Tomar exactamente segn se indica y durante el nmero exacto de das para los que fue recetado. Tome sus dosis a intervalos regulares. La mayora de las mujeres toman este medicamento durante un perodo de 5 das, pero la duracin del tratamiento puede ajustarse en algunos casos. Su mdico le indicarn el da en que debe empezar a tomar este medicamento y le darn las indicaciones para el seguimiento. No tome su medicamento con una frecuencia mayor que la indicada. Hable con su pediatra para informarse acerca del uso de este medicamento en nios. Puede requerir atencin especial. Sobredosis: Pngase en contacto inmediatamente con un centro toxicolgico o una sala de urgencia si usted cree que haya tomado demasiado medicamento. ATENCIN: Este  medicamento es solo para usted. No comparta este medicamento con nadie. Qu sucede si me olvido de una dosis? Si olvida una dosis, tmela lo antes posible. Si es casi la hora de la prxima dosis, tome slo esa dosis. No tome dosis adicionales o dobles. Qu puede interactuar con este medicamento? -suplementos a base de hierbas o dietticos como cohosh azul, cohosh negro, vitex o DHEA -prasterona Puede ser que esta lista no menciona todas las posibles interacciones. Informe a su profesional de la salud de todos los productos a base de hierbas, medicamentos de venta libre o suplementos nutritivos que est tomando. Si usted fuma, consume bebidas alcohlicas o si utiliza drogas ilegales, indqueselo tambin a su profesional de la salud. Algunas sustancias pueden interactuar con su medicamento. A qu debo estar atento al usar este medicamento? Asegrese que usted sepa cmo y cundo usar este medicamento. Debe saber cundo est ovulando y cundo debe tener relaciones sexuales a fin de incrementar las posibilidades de quedar embarazada. Visite a su mdico o a su profesional de la salud para chequear su evolucin peridicamente. Es posible que deba controlar sus niveles de hormonas en la sangre o que le indique alguna prueba de orina domiciliaria para controlar la ovulacin. Trate de no faltar a las citas. En comparacin con otros tratamientos para la fertilidad, este medicamento no aumenta mucho la posibilidad de tener un embarazo mltiple. Aproximadamente 5 de cada 100 mujeres que toman este medicamento tienen la posibilidad de quedar embarazadas con mellizos. Si piensa que est embarazada deje de tomar este medicamento de inmediato y comunquese con su mdico o con su profesional de la salud. Este medicamento no es para tratamientos a largo plazo. La mayora de las mujeres que se benefician del uso de este   medicamento obtienen resultados dentro de los tres primeros ciclos (meses). Su mdico o su profesional  de la salud volver a evaluar su problema. Este medicamento generalmente se utiliza durante no ms de 6 ciclos de tratamiento. Puede experimentar mareos o somnolencia. No conduzca ni utilice maquinaria ni haga nada que le exija permanecer en estado de alerta hasta que sepa cmo le afecta este medicamento. No se siente ni se ponga de pie con rapidez. Esto reduce el riesgo de mareos o desmayos. El consumir bebidas alcohlicas o el fumar tabaco puede disminuir las posibilidades de quedar embarazada. Limitar o dejar de consumir alcohol o el uso de tabaco durante su tratamiento para la fertilidad. Qu efectos secundarios puedo tener al utilizar este medicamento? Efectos secundarios que debe informar a su mdico o a su profesional de la salud tan pronto como sea posible: -reacciones alrgicas como erupcin cutnea, picazn o urticarias, hinchazn de la cara, labios o lengua -problemas respiratorios -cambios en la visin -retencin de lquidos -nuseas, vmito -hinchazn o dolor plvico -dolor abdominal severo -aumento de peso repentino Efectos secundarios que, por lo general, no requieren atencin mdica (debe informarlos a su mdico o a su profesional de la salud si persisten o si son molestos): -molestia en las mamas -sofocos -molestia plvica leve -nuseas leves Puede ser que esta lista no menciona todos los posibles efectos secundarios. Comunquese a su mdico por asesoramiento mdico sobre los efectos secundarios. Usted puede informar los efectos secundarios a la FDA por telfono al 1-800-FDA-1088. Dnde debo guardar mi medicina? Mantngala fuera del alcance de los nios. Gurdela a temperatura ambiente, entre 15 y 30 grados C (59 y 86 grados F). Protjala del calor, la luz y la humedad. Deseche todo el medicamento que no haya utilizado, despus de la fecha de vencimiento. ATENCIN: Este folleto es un resumen. Puede ser que no cubra toda la posible informacin. Si usted tiene preguntas acerca  de esta medicina, consulte con su mdico, su farmacutico o su profesional de la salud.  2013, Elsevier/Gold Standard. (10/01/2006 11:32:00 AM)  

## 2012-07-01 ENCOUNTER — Ambulatory Visit: Payer: Self-pay | Admitting: Gynecology

## 2012-07-31 ENCOUNTER — Other Ambulatory Visit: Payer: Self-pay

## 2012-07-31 DIAGNOSIS — N970 Female infertility associated with anovulation: Secondary | ICD-10-CM

## 2012-07-31 LAB — PROGESTERONE: Progesterone: 7.7 ng/mL

## 2012-09-26 ENCOUNTER — Encounter: Payer: Self-pay | Admitting: Gynecology

## 2012-09-26 ENCOUNTER — Ambulatory Visit (INDEPENDENT_AMBULATORY_CARE_PROVIDER_SITE_OTHER): Payer: Self-pay | Admitting: Gynecology

## 2012-09-26 VITALS — BP 120/82

## 2012-09-26 DIAGNOSIS — N949 Unspecified condition associated with female genital organs and menstrual cycle: Secondary | ICD-10-CM

## 2012-09-26 DIAGNOSIS — N938 Other specified abnormal uterine and vaginal bleeding: Secondary | ICD-10-CM

## 2012-09-26 LAB — PREGNANCY, URINE: Preg Test, Ur: NEGATIVE

## 2012-09-26 LAB — HCG, SERUM, QUALITATIVE: Preg, Serum: NEGATIVE

## 2012-09-26 NOTE — Progress Notes (Signed)
The patient presented to the office today because of having 2 menstrual cycles close together. Patient with history of primary infertility. The patient was started on clomiphene citrate 50 mg day 5 through 9 and she was going to time or intercourse and a 12-16. We were going to do this for 3 months and if she did not conceive she was going to follow up with a sonohysterogram and repeat of her husband's semen analysis. Patient's prior menstrual cycles were every 21-35 days. She stated that on March 15 she had a normal for a cycle. On April 12 she had a four-day cycle on May 12 she had a three-day cycle and began bleeding on May 28. See previous encounter November 28 for detail of previous infertility workup. Patient denies any nausea or vomiting or any pelvic pain.  Exam: Bartholin's urethra Skene's within normal limits Vagina: Menstrual blood was present Cervix: No gross lesions on inspection Uterus: Anteverted normal size shape and consistency Adnexa: No palpable mass or tenderness Rectal exam: Not done  Assessment/plan: 2 episodes of menstrual cycle this month on clomiphene citrate 50 mg day 5 through 9. Of note patient patient had a normal mid seeker toward progesterone level after the first cycle the clomiphene citrate while on this dose. The patient had a negative urine pregnancy test today. Will obtain a qualitative beta hCG today. If it is negative were going to hold off for 3 months on the ovulation induction medication. She may then at home try to time or intercourse by using the ovulation predictor kit as previously instructed. She will continue her prenatal vitamins. We will monitor the next few months off the ovulation induction medication. Instructions were provided in Spanish.

## 2012-12-18 ENCOUNTER — Encounter: Payer: Self-pay | Admitting: Family Medicine

## 2012-12-18 ENCOUNTER — Ambulatory Visit (INDEPENDENT_AMBULATORY_CARE_PROVIDER_SITE_OTHER): Payer: No Typology Code available for payment source | Admitting: Family Medicine

## 2012-12-18 VITALS — BP 112/75 | HR 80 | Temp 98.0°F | Ht 65.5 in | Wt 136.0 lb

## 2012-12-18 DIAGNOSIS — R109 Unspecified abdominal pain: Secondary | ICD-10-CM

## 2012-12-18 DIAGNOSIS — K5900 Constipation, unspecified: Secondary | ICD-10-CM

## 2012-12-18 NOTE — Patient Instructions (Signed)
It was nice to meet you today!  For the constipation, try a stool softener. You can start with over the counter docusate (Colace). Let me know if you keep having problems.  For the pain with your period, I recommend you talk to your gynecologist. You could have something called endometriosis.  Your next well woman visit will be in one year.  Be well, Dr. Pollie Meyer

## 2012-12-18 NOTE — Progress Notes (Signed)
Patient ID: Debbie Campos, female   DOB: 1981-01-07, 32 y.o.   MRN: [id number]   HPI:  Epigastric pain: Reports pain in the epigastric area, which occurs monthly on the first day of her period. She gets her period every 28 days. This has gone on for 4 years. Sees GYN as she is trying to get pregnant, but hasn't discussed this with her gynecologist. She has not taken any medicine for this pain. Has appointment coming up there soon (needs to call and schedule it). Had ultrasound done there that showed "air bubble" and wants another one, asks if I can order that for her.   Umbilical pain: Reports that she can feel a "pulse" type sensation in her abdomen, so she rubs her belly. When she does that, she gets pain around her umbilicus. She does note problems with constipation, stating that she has BM about once per day, but frequently has stool that are hard balls.   ROS: See HPI. + constipation.  PMFSH: works in housekeeping. Eats beans, rice, chicken.  PHYSICAL EXAM: BP 112/75  Pulse 80  Temp(Src) 98 F (36.7 C) (Oral)  Ht 5' 5.5" (1.664 m)  Wt 136 lb (61.689 kg)  BMI 22.28 kg/m2 Gen: NAD Heart: RRR Lungs: CTAB, NWOB HEENT: TM's clear bilaterally, MMM, no oropharyngeal lesions, no LAD Abd: soft, mild TTP around umbilicus with palpation of LLQ. +BS. No organomegaly or masses palpable. Neuro: grossly nonfocal, speech intact  ASSESSMENT/PLAN:  # Health maintenance:  -not due for pap today, had one done in 2013 which was normal. Pelvic exam thus deferred. -will plan to screen for lipids at next well woman visit in one year when turns 33  # Epigastric pain: - as this occurs only with the first day of her periods, suspect this could possibly be endometriosis. Discussed this briefly with patient today. Advised she speak to her GYN doctor about it. Will not rx contraceptives as patient is actively trying to get pregnant. - advised that if has GYN appointment coming up, and that  doctor had previously seen abnormality on u/s, then he should be the one to follow this up and evaluate for need of additional imaging. Patient understood.  # Constipation: suspect umbilical pain is related to constipation. Unclear to me why patient "rubs" her abdomen with her hands so much, but the pain is present when she rubs it. Advised not rubbing it. Recommended starting docusate for stool softener and monitor for improvement in abdominal pain. F/u if not improving.

## 2012-12-23 ENCOUNTER — Encounter: Payer: Self-pay | Admitting: Gynecology

## 2012-12-23 ENCOUNTER — Ambulatory Visit (INDEPENDENT_AMBULATORY_CARE_PROVIDER_SITE_OTHER): Payer: Self-pay | Admitting: Gynecology

## 2012-12-23 VITALS — BP 118/72

## 2012-12-23 DIAGNOSIS — N979 Female infertility, unspecified: Secondary | ICD-10-CM

## 2012-12-23 DIAGNOSIS — IMO0002 Reserved for concepts with insufficient information to code with codable children: Secondary | ICD-10-CM

## 2012-12-23 MED ORDER — DEXAMETHASONE 0.5 MG PO TABS: ORAL_TABLET | ORAL | Status: DC

## 2012-12-23 NOTE — Patient Instructions (Addendum)
Tomar medicina ubna tableta diaria del dia 2 del ciclo hasta el dia 18 del ciclo por proximo tres meses

## 2012-12-23 NOTE — Progress Notes (Signed)
Patient presented to the office today for further discussion of her primary infertility. Infertility diagnoses and treatment up to date as follows  Hysterosalpingogram report as follows:  Findings: The endometrial cavity of the uterus is normal in  contour and appearance.  Contrast filling of both fallopian tubes is seen, and both tubes  are normal in appearance. Intraperitoneal spill of contrast from  both fallopian tubes is demonstrated.  IMPRESSION:  1. Fallopian tubes are patent bilaterally.  2. Question of lower uterine segment fibroid given the contour of  the cervix.  Semen analysis:  Low number of normal forms otherwise normal semen analysis  Patient had reported at one time that her cycles range between 20-35 days so she was placed on clomiphene citrate 50 mg day 5 through 9 for 3 months and timing of intercourse with ovulation predictor kit. Her mid secretory progesterone level was normal. At one point she had 2 cycles in one month. Patient has not been on clomiphene citrate for 3 months and stated that she has been having normal menstrual cycles and ovulation predictor kit indicative that she has been ovulating and conquer intercourse.  We are going to place her on dexamethasone 0.25 mg to take 1 by mouth daily from day 2-18 of her cycle to see if this will help enhance her ovulation inability to conceive. She will return back in 3 months of not successful. And we will need to pursue them with the sonohysterogram due the fact that during the time of the HSG there was a questionable lower uterine segment fibroid. All the above explained in [place].

## 2013-01-08 ENCOUNTER — Encounter: Payer: Self-pay | Admitting: Family Medicine

## 2013-01-08 ENCOUNTER — Ambulatory Visit (INDEPENDENT_AMBULATORY_CARE_PROVIDER_SITE_OTHER): Payer: No Typology Code available for payment source | Admitting: Family Medicine

## 2013-01-08 VITALS — BP 122/79 | HR 84 | Temp 98.7°F | Ht 65.5 in | Wt 137.0 lb

## 2013-01-08 DIAGNOSIS — R300 Dysuria: Secondary | ICD-10-CM

## 2013-01-08 DIAGNOSIS — N390 Urinary tract infection, site not specified: Secondary | ICD-10-CM

## 2013-01-08 DIAGNOSIS — Z23 Encounter for immunization: Secondary | ICD-10-CM

## 2013-01-08 LAB — POCT URINALYSIS DIPSTICK
Bilirubin, UA: NEGATIVE
Glucose, UA: NEGATIVE
Ketones, UA: NEGATIVE
Nitrite, UA: NEGATIVE
Protein, UA: NEGATIVE
Spec Grav, UA: 1.025
Urobilinogen, UA: 0.2
pH, UA: 5.5

## 2013-01-08 LAB — POCT UA - MICROSCOPIC ONLY

## 2013-01-08 LAB — POCT URINE PREGNANCY: Preg Test, Ur: NEGATIVE

## 2013-01-08 MED ORDER — CEPHALEXIN 500 MG PO CAPS: 500.0000 mg | ORAL_CAPSULE | Freq: Four times a day (QID) | ORAL | Status: DC

## 2013-01-08 NOTE — Progress Notes (Signed)
Patient ID: Debbie Campos, female   DOB: [birth year], 32 y.o.   MRN: [id number]  [facility] Danel Studzinski M. Markeeta Scalf, MD Phone: [phone]   Subjective: HPI: Patient is a 32 y.o. female presenting to clinic today for same day appointment for dysuria. Interpreter present for entire history and exam.   DYSURIA Describes as internal pain after urination.  Onset: 2 days ago   Worsening: Unchanged    Symptoms Urgency: yes  Frequency: no, but drinking more water  Hesitancy: no  Hematuria: no  Flank Pain: no  Fever: no  Nausea/Vomiting: no  Pregnant: no, not taking contraception. LMP 8/19. Trying to get pregnant STD exposure/history: no Discharge: no Irritants: no Rash: no  Red Flags  : (Risk Factors for Complicated UTI) Recent Antibiotic Usage (last 30 days): no  Symptoms lasting more than seven (7) days: no  More than 3 UTI's last 12 months: no  PMH of  1. DM: no 2. Renal Disease/Calculi: no 3. Urinary Tract Abnormality: no  4. Instrumentation/Trauma: no 5. Immunosuppression: no  History Reviewed: Non smoker.  ROS: Please see HPI above.  Objective: Office vital signs reviewed. BP 122/79  Pulse 84  Temp(Src) 98.7 F (37.1 C) (Oral)  Ht 5' 5.5" (1.664 m)  Wt 137 lb (62.143 kg)  BMI 22.44 kg/m2  LMP 12/16/2012  Physical Examination:  General: Awake, alert. NAD HEENT: Atraumatic, normocephalic. MMM. Pulm: CTAB, no wheezes Cardio: RRR, no murmurs appreciated Abdomen:+BS, soft, mild suprapubic tenderness, nondistended Extremities: No edema Neuro: Grossly intact  Assessment: 32 y.o. female with UTI  Plan: See Problem List and After Visit Summary

## 2013-01-08 NOTE — Patient Instructions (Addendum)
You will need to take your antibiotic 4 times per day for one week. Come back if you do not feel better.  Debbie Campos M. Debbie Campos, M.D.  Infeccin urinaria  (Urinary Tract Infection)  La infeccin urinaria puede ocurrir en Corporate treasurer del tracto urinario. El tracto urinario es un sistema de drenaje del cuerpo por el que se eliminan los desechos y el exceso de [place]. El tracto urinario est formado por dos riones, dos urteres, la vejiga y Engineer, mining. Los riones son rganos que tienen forma de frijol. Cada rin tiene aproximadamente el tamao del puo. Estn situados debajo de las [place], uno a cada lado de la columna vertebral CAUSAS  La causa de la infeccin son los microbios, que son organismos microscpicos, que incluyen hongos, virus, y bacterias. Estos organismos son tan pequeos que slo pueden verse a travs del microscopio. Las bacterias son los microorganismos que ms comnmente causan infecciones urinarias.  SNTOMAS  Los sntomas pueden variar segn la edad y el sexo del paciente y por la ubicacin de la infeccin. Los sntomas en las mujeres jvenes incluyen la necesidad frecuente e intensa de orinar y una sensacin dolorosa de ardor en la vejiga o en la uretra durante la miccin. Las mujeres y los hombres mayores podrn sentir cansancio, temblores y debilidad y Futures trader musculares y Engineer, mining abdominal. Si tiene [place] [employer], puede significar que la infeccin est en los riones. Otros sntomas son dolor en la espalda o en los lados debajo de las [place], nuseas y vmitos.  DIAGNSTICO  Para diagnosticar una infeccin urinaria, el mdico le preguntar acerca de sus sntomas. [employer] una [place] de [place]. La muestra de orina se analiza para Engineer, manufacturing bacterias y glbulos blancos de Risk manager. Los glbulos blancos se forman en el organismo para ayudar a Artist las infecciones.  TRATAMIENTO  Por lo general, las infecciones urinarias pueden tratarse con medicamentos. Debido a  que la [employer] de las infecciones son causadas por bacterias, por lo general pueden tratarse con antibiticos. La eleccin del antibitico y la duracin del tratamiento depender de sus sntomas y el tipo de bacteria causante de la infeccin.  INSTRUCCIONES PARA EL CUIDADO EN EL HOGAR   Si le recetaron antibiticos, tmelos exactamente como su mdico le indique. Termine el medicamento aunque se sienta mejor despus de haber tomado slo algunos.  Beba gran cantidad de lquido para mantener la orina de tono claro o color amarillo plido.  Evite la cafena, el t y las [address]. Estas sustancias irritan la vejiga.  Vaciar la vejiga con frecuencia. Evite retener la orina durante largos perodos.  Vace la vejiga antes y despus de Management consultant.  Despus de mover el intestino, las mujeres deben higienizarse la regin perineal desde adelante hacia atrs. Use slo un papel tissue por vez. SOLICITE ATENCIN MDICA SI:   Siente dolor en la espalda.  Le sube la fiebre.  Los sntomas no mejoran luego de [address]. SOLICITE ATENCIN MDICA DE INMEDIATO SI:   Siente dolor intenso en la espalda o en la zona inferior del abdomen.  Comienza a sentir escalofros.  Tiene nuseas o vmitos.  Tiene una sensacin continua de quemazn o molestias al [employer]. ASEGRESE DE QUE:   Comprende estas instrucciones.  Controlar su enfermedad.  Solicitar ayuda de inmediato si no mejora o empeora. Document Released: 01/24/2005 Document Revised: 01/09/2012 [facility] Patient Information 2014 [place], [place].

## 2013-01-09 DIAGNOSIS — N390 Urinary tract infection, site not specified: Secondary | ICD-10-CM | POA: Insufficient documentation

## 2013-01-09 NOTE — Assessment & Plan Note (Signed)
Upreg negative. UA consistent with UTI. Will send urine for culture. Treated with Keflex PO qid x1 week. Will call if culture shows different sensitivities. Patient to follow up if she fails to improve or gets worse. Drink plenty of fluids and rest.

## 2013-01-10 LAB — URINE CULTURE: Colony Count: >=100000

## 2013-03-11 ENCOUNTER — Other Ambulatory Visit: Payer: Self-pay | Admitting: Gynecology

## 2013-03-11 DIAGNOSIS — D259 Leiomyoma of uterus, unspecified: Secondary | ICD-10-CM

## 2013-03-11 DIAGNOSIS — IMO0002 Reserved for concepts with insufficient information to code with codable children: Secondary | ICD-10-CM

## 2013-03-16 ENCOUNTER — Ambulatory Visit: Payer: Self-pay | Admitting: Gynecology

## 2013-03-16 ENCOUNTER — Other Ambulatory Visit: Payer: Self-pay

## 2013-04-15 ENCOUNTER — Other Ambulatory Visit: Payer: Self-pay

## 2013-04-15 ENCOUNTER — Other Ambulatory Visit: Payer: Self-pay | Admitting: *Deleted

## 2013-04-15 ENCOUNTER — Other Ambulatory Visit: Payer: Self-pay | Admitting: Gynecology

## 2013-04-15 ENCOUNTER — Ambulatory Visit: Payer: Self-pay | Admitting: Gynecology

## 2013-04-15 ENCOUNTER — Ambulatory Visit (INDEPENDENT_AMBULATORY_CARE_PROVIDER_SITE_OTHER): Payer: Self-pay | Admitting: Gynecology

## 2013-04-15 ENCOUNTER — Ambulatory Visit (INDEPENDENT_AMBULATORY_CARE_PROVIDER_SITE_OTHER): Payer: Self-pay

## 2013-04-15 DIAGNOSIS — IMO0002 Reserved for concepts with insufficient information to code with codable children: Secondary | ICD-10-CM

## 2013-04-15 DIAGNOSIS — D259 Leiomyoma of uterus, unspecified: Secondary | ICD-10-CM

## 2013-04-15 DIAGNOSIS — R9389 Abnormal findings on diagnostic imaging of other specified body structures: Secondary | ICD-10-CM

## 2013-04-15 DIAGNOSIS — N831 Corpus luteum cyst of ovary, unspecified side: Secondary | ICD-10-CM

## 2013-04-15 DIAGNOSIS — N979 Female infertility, unspecified: Secondary | ICD-10-CM

## 2013-04-15 NOTE — Patient Instructions (Signed)
Fertilización in vitro   (In Vitro Fertilization)  La fertilización in vitro (FIV) es un tipo de tecnología aplicada a la reproducción asistida. La FIV consiste en una serie de procedimientos para tratar la infertilidad o problemas genéticos con el objeto de prestar asistencia para la concepción de un bebé. Durante la FIV, los óvulos se recuperan de los ovarios y se combinan con los espermatozoides en el laboratorio para fertilizarlos. Uno o más óvulos fertilizados (embriones) se insertan en el útero a través del cuello uterino. Las candidatas para la FIV son:  · Personas que sufren infertilidad.  · Las mujeres que sufren una menopausia prematura o una falla ovárica.  · Las mujeres a las que se les han extirpado ambos ovarios. En este caso, deberá usarse una donante de óvulos.  · Mujeres que tienen dañadas u obstruidas las trompas de [place]  No hay límite de edad para la FIV, pero no se recomienda para las mujeres postmenopáusicas. La edad ideal para este procedimiento es de 35 años o menos. A las mujeres de más de 41 años generalmente se les aconseja utilizar una donante de óvulos durante la FIV para aumentar las probabilidades de éxito.  INFORME A SU MÉDICO:   · Cualquier alergia que tenga.  · Todos los medicamentos que utiliza, incluyendo vitaminas, hierbas, gotas oftálmicas, cremas y medicamentos de venta libre.  · Problemas previos que usted o los miembros de su familia hayan tenido con el uso de anestésicos.  · Todo problema médico o genético que usted o los miembros de su familia hayan sufrido.  · Enfermedades de la sangre.  · Cirugías previas.  · Embarazos previos.  · Padecimientos médicos.  · Excesos en el consumo de alcohol o de tabaco.  · Historia de consumo de drogas.  RIESGOS Y COMPLICACIONES   Generalmente, el procedimiento de FIV es un procedimiento seguro. Sin embargo, como en cualquier procedimiento, pueden surgir complicaciones. Las complicaciones posibles son:  · Sangrado o  infección.  · Problemas con la anestesia.  · Coágulos sanguíneos.  · El procedimiento no tiene éxito.  · Tener gemelos o embarazos múltiples  · Aumento del riesgo de parto prematuro.  ANTES DEL PROCEDIMIENTO   Antes de comenzar el ciclo de FIV, usted y el donante serán sometidos a estudios para asegurarse de que la FIV es la mejor opción. Algunas personas pueden no beneficiarse con este tipo de tecnología para la asistencia reproductiva.   · Usted y el donante tendrán que proporcionar una historia clínica completa y la historia clínica de sus familiares.  · Usted y el donante serán sometidos a un examen físico.  · Usted y el donante podrán necesitar realizarse análisis de sangre para detectar enfermedades infecciosas, incluyendo el VIH.  · Podrán solicitarle otros estudios, por ejemplo:  · Estudio de los ovarios para determinar la calidad y la cantidad de óvulos.  · Estudios hormonales y de ovulación.  · Un examen del útero. Este se realiza por medio de un tipo de ecografía, después de inyectar un líquido en el útero a través del cuello uterino (ecohisterografía o utilizando un tubo delgado y flexible, con una pequeña luz y cámara en un extremo (histeroscopio).  · La esperma del donante será tomada y analizada para ver si es normal, hay cantidad suficiente para fertilizar el óvulo y que actúan normalmente después de las relaciones sexuales (examen postcoital ).  PROCEDIMIENTO   La FIV consiste en varios pasos. Estos procedimientos pueden realizarse en el consultorio médico o en una clínica.   Un ciclo de FIV puede llevar alrededor de 2 semanas, y podrá requerirse más de un ciclo. Los pasos de la FIV son:  · Estimulación ovárica. Si se utilizan sus óvulos durante la FIV, al comienzo del ciclo comenzará un tratamiento con hormonas artificiales (sintéticas. Estas hormonas estimulan los ovarios para producir múltiples óvulos, a diferencia del óvulo único que normalmente se desarrolla cada mes. Es necesario obtener  múltiples óvulos debido a que algunos de ellos no se fertilizarán o no se desarrollarán normalmente después de la fertilización.  · Extracción de óvulos. Utilizando imágenes radiográficas como guía, el medico insertará una aguja fina a través de la vagina y la dirigirá a los ovarios y sacos (folículos) que contienen los óvulos La aguja se conecta a un dispositivo de succión, que retira los óvulos y el líquido de cada folículo, uno por vez. El procedimiento se repite para el otro ovario.  · Inseminación y fertilización. El esperma se une a los ovarios (inseminación) y se almacena en una cámara que tiene un ambiente controlado. Generalmente el esperma ingresa (fertiliza) el óvulo unas horas después de la inseminación.  · Transferencia embrionaria. El médico colocará los embriones en su útero usando un tubo delgado (catéter) que contiene los embriones. El catéter se inserta en la vagina a través del cuello uterino, y de allí al útero. La transferencia de óvulos generalmente ocurre entre 2 a 6 días luego de la extracción de los óvulos. Si hay éxito, el embrión se adherirá (implante) en la superficie interna del útero alrededor de 6 a 10 días luego de la extracción de los óvulos. Si se implanta el embrión en la membrana que cubre el útero y se desarrolla, resulta en un embarazo.  DESPUÉS DEL PROCEDIMIENTO   · Tendrá que permanecer acostada durante una hora o más, antes de volver a su casa.  · Será necesario que siga con la terapia hormonal por 3 meses aproximadamente, o según lo que le indique su médico.  · Puede retomar su dieta y las actividades habituales.  Document Released: 12/17/2012  ExitCare® Patient Information ©2014 [employer].

## 2013-04-15 NOTE — Progress Notes (Signed)
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 Also at time of her hysterosalpingogram there was a questionable area near the lower uterine segment that warranted further evaluation.  Recent HSG had demonstrated the following: Hysterosalpingogram report as follows:  Findings: The endometrial cavity of the uterus is normal in  contour and appearance.  Contrast filling of both fallopian tubes is seen, and both tubes  are normal in appearance. Intraperitoneal spill of contrast from  both fallopian tubes is demonstrated.  IMPRESSION:  1. Fallopian tubes are patent bilaterally.  2. Question of lower uterine segment fibroid given the contour of  the cervix.   Ultrasound today demonstrated uterus measured 8.0 x 5.8 x 3.7 cm with endometrial stripe of 4.6 mm. After instilling normal saline there was no intracavitary defect noted. Right ovary was normal. Left ovary small corpus luteum cyst measured 15 x 12 x 19 mm.  Patient's husband recent semen analysis Low number of normal forms otherwise normal semen analysis  Patient had gone through 3 cycles are clomiphene citrate to regulate her cycles and sometimes they fluctuated between 20 and 35 days. She had a mid secretory serum progesterone level I of 2 times when tested. She had also been placed on dexamethasone 0.25 mg daily from day 2 through 18 in an effort to enhance ovulation but she has not conceived.  Assessment/plan: 32 year old gravida 0 with three-year history of primary infertility with normal HSG and semen analysis as well as TSH and prolactin has not successfully conceived when clomiphene citrate and dexamethasone was added to the treatment options. Because of age I'm going to refer her to the reproductive endocrinologist Fermin Schwab for consideration of in vitro fertilization which she is ready to  move forward. Meanwhile she will continue on her prenatal vitamin and timing of intercourse with utilization of ovulation predictor kit. All the above information was shared with the patient in Spanish.

## 2013-04-17 ENCOUNTER — Telehealth: Payer: Self-pay | Admitting: *Deleted

## 2013-04-17 NOTE — Telephone Encounter (Signed)
Message copied by Aura Camps on Fri Apr 17, 2013  2:45 PM ------      Message from: Jerilynn Mages      Created: Fri Apr 17, 2013 12:07 PM      Regarding: refferal       "I'm going to refer her to the reproductive endocrinologist Fermin Schwab for consideration of in vitro fertilization which she is ready to move forward." per JF note. Let me know when this is schedule I can call patient. Thx ------

## 2013-04-17 NOTE — Telephone Encounter (Signed)
Referral  faxed to Dr.Yalcinkaya office they will contact me with time & date.

## 2013-04-29 NOTE — Telephone Encounter (Signed)
Pt scheduled on May 22, 2013 @ 10:00 am

## 2013-05-26 ENCOUNTER — Ambulatory Visit: Payer: No Typology Code available for payment source

## 2013-10-19 ENCOUNTER — Ambulatory Visit (INDEPENDENT_AMBULATORY_CARE_PROVIDER_SITE_OTHER): Payer: No Typology Code available for payment source | Admitting: Emergency Medicine

## 2013-10-19 ENCOUNTER — Encounter: Payer: Self-pay | Admitting: Emergency Medicine

## 2013-10-19 VITALS — BP 113/75 | HR 78 | Ht 65.5 in | Wt 137.0 lb

## 2013-10-19 DIAGNOSIS — S6980XA Other specified injuries of unspecified wrist, hand and finger(s), initial encounter: Secondary | ICD-10-CM

## 2013-10-19 DIAGNOSIS — S6990XA Unspecified injury of unspecified wrist, hand and finger(s), initial encounter: Secondary | ICD-10-CM

## 2013-10-19 DIAGNOSIS — S6991XA Unspecified injury of right wrist, hand and finger(s), initial encounter: Secondary | ICD-10-CM | POA: Insufficient documentation

## 2013-10-19 DIAGNOSIS — R6884 Jaw pain: Secondary | ICD-10-CM | POA: Insufficient documentation

## 2013-10-19 NOTE — Assessment & Plan Note (Signed)
I suspect some bruising, possibly bone bruising, from recurrent pressure due to her work. Discussed no pressure on the affected area for the next 2 weeks. Ibuprofen 600 mg 3 times a day for 5 days then as needed. Recommended icing 2-3 times a day. Followup if not improving in 2 weeks.

## 2013-10-19 NOTE — Patient Instructions (Signed)
Fue Engineer, drilling conocerte!  Creo que magullado el hueso en su dedo. No se rompe. Tomar ibuprofeno 600 mg 3 veces al da para la prxima semana, a continuacin, segn sea necesario. Ponga hielo en el dedo durante 10 minutos 3 veces al da para la prxima semana. Evite poner presin en el rea durante las prximas 2 semanas.  Si no est mejorando en 2 semanas, por favor regrese.  It was nice to meet you!  I think you bruised the bone in your finger.  It is not broken. Take ibuprofen 600mg  3 times a day for the next week, then as needed. Put ice on the finger for 10 minutes 3 times a day for the next week. Avoid putting pressure on the area for the next 2 weeks.  If it is not getting any better in 2 weeks, please come back.

## 2013-10-19 NOTE — Progress Notes (Signed)
   Subjective:    Patient ID: Debbie Campos, female    DOB: 16-Sep-1980, 33 y.o.   MRN: [id number]  HPI Narissa Beaufort is here for a same-day appointment for right index finger pain. Interpreter present for entire visit.  She states for about 1 week, she has had pain in her right index finger. It is located at the radiocarpal MCP joint only. She had a similar pain previously in the left hand, but that resolved on its own. She has not been taking anything for pain. She does janitorial work, which includes a lot of mopping. She states her pain is worse with gripping the mop. She also will have pain when she tries to pick up something heavy with that hand. Denies any numbness, tingling, swelling. No weakness in the right hand.  Current Outpatient Prescriptions on File Prior to Visit  Medication Sig Dispense Refill  . dexamethasone (DECADRON) 0.5 MG tablet Take one daily from day 2 of cycle to day 18 of cycle  20 tablet  3   No current facility-administered medications on file prior to visit.    I have reviewed and updated the following as appropriate: allergies and current medications SHx: never smoker   Review of Systems See HPI    Objective:   Physical Exam BP 113/75  Pulse 78  Ht 5' 5.5" (1.664 m)  Wt 137 lb (62.143 kg)  BMI 22.44 kg/m2 Gen: alert, cooperative, NAD Right hand: minimal swelling over radial first MCP joint; no erythema or warmth; tender to palpation over radial side of proximal proximal phalanx only; no joint laxity; no nodules; no pain with active or passive movement; full strength without pain     Assessment & Plan:

## 2013-10-28 ENCOUNTER — Ambulatory Visit (INDEPENDENT_AMBULATORY_CARE_PROVIDER_SITE_OTHER): Payer: No Typology Code available for payment source | Admitting: Family Medicine

## 2013-10-28 ENCOUNTER — Encounter: Payer: Self-pay | Admitting: Family Medicine

## 2013-10-28 VITALS — BP 103/71 | HR 90 | Temp 98.4°F | Ht 65.0 in | Wt 140.7 lb

## 2013-10-28 DIAGNOSIS — Z13220 Encounter for screening for lipoid disorders: Secondary | ICD-10-CM

## 2013-10-28 DIAGNOSIS — Z0000 Encounter for general adult medical examination without abnormal findings: Secondary | ICD-10-CM

## 2013-10-28 DIAGNOSIS — R6884 Jaw pain: Secondary | ICD-10-CM

## 2013-10-28 NOTE — Patient Instructions (Addendum)
It was great to see you again today!  I will put you on the dentist list.  On your way out, schedule an appointment one morning to come back for fasting labs. Do not eat or drink anything other than water the morning of your lab appointment until after your labs are drawn.    Be well, Dr. Ardelia Mems    Mantenimiento de la salud en las mujeres (Health Maintenance, Females) Un estilo de vida saludable y los cuidados preventivos pueden favorecer la salud y [place].   Haga exmenes regulares de la salud en general, dentales y de los ojos.  Consuma una dieta saludable. Los [employer], frutas, granos enteros, productos lcteos descremados y protenas magras contienen los nutrientes que usted necesita sin necesidad de consumir muchas caloras. Disminuya el consumo de alimentos con alto contenido de grasas slidas, azcar y sal agregadas. Si es necesario, pdale informacin acerca de [place] a su mdico.  La actividad fsica regular es una de las cosas ms importantes que puede hacer por su salud. Los adultos deben hacer al menos 150 minutos de ejercicios de intensidad moderada (cualquier actividad que aumente la frecuencia cardaca y lo haga transpirar) cada semana. Adems, la [employer] de los adultos necesita ejercicios de fortalecimiento muscular 2  ms [employer].   Mantenga un peso saludable. El ndice de masa corporal [facility]) es una herramienta que identifica posibles problemas con [place]. Proporciona una estimacin de la grasa corporal basndose en el peso y la altura. El mdico podr determinar su [facility] y podr ayudarlo a Scientist, forensic o Theatre manager un peso saludable. Para los adultos de 20 aos o ms:  Un [facility] menor a 18,5 se considera bajo peso.  Un [facility] entre 18,5 y 24,9 es normal.  Un [facility] entre 25 y 29,9 es sobrepeso.  Un IMC entre 30 o ms es obesidad.  Mantenga un nivel normal de lpidos y colesterol en sangre practicando actividad fsica y minimizando la  ingesta de grasas saturadas. Consuma una dieta balanceada e incluya variedad de frutas y vegetales. Los [employer] de lpidos y Research officer, trade union en sangre deben Medical laboratory scientific officer a los 5 aos y repetirse cada 5 aos. Si los niveles de colesterol son altos, tiene ms de 50 aos o tiene riesgo elevado de sufrir enfermedades cardacas, Designer, industrial/product controlarse con ms frecuencia.Si tiene [employer] de lpidos y colesterol, debe recibir tratamiento con medicamentos, si la dieta y el ejercicio no son efectivos.  Si fuma, consulte con [employer] de las opciones para dejar de [place]. Si no lo hace, no comience.  Se recomienda realizar controles para el cncer de pulmn a los [employer] de 58 a 39 aos que tengan alto riesgo de Actor la enfermedad debido a sus antecedentes de tabaquismo. Se recomienda realizar una tomografa computarizada (TC) anual de dosis bajas en aquellas personas tienen un promedio de 30 paquetes al ao de historia de tabaquismo y en la actualidad fuman o han dejado de fumar dentro de los ltimos 15 aos. Un paquete al ao de fumador es fumar un promedio de 1 paquete de cigarrillos al da durante 1 ao (por ejemplo: 1 paquete al da durante [place] 15 aos). El control anual debe continuar hasta que el fumador haya dejado de fumar durante al menos 15 aos. Screening anual debe interrumpirse en aquellas personas que desarrollan un problema de salud que les impida seguir un tratamiento para el cncer de pulmn.  Si est embarazada no beba  alcohol. Si est amamantando, beba alcohol con prudencia. Si elige beber alcohol, no se exceda de 1 medida por da. Se considera una medida a 12 onzas (355 ml) de cerveza, 5 onzas (148 ml) de vino, o 1,5 onzas (44 ml) de licor.  No comparta agujas con nadie. Pida ayuda si necesita asistencia o instrucciones con respecto a abandonar el consumo de alcohol, cigarrillos o drogas.  La hipertensin arterial causa  enfermedades cardacas y [place] el riesgo de ictus. Debe controlar su presin arterial al menos cada 1 o 2 aos. La presin arterial elevada que persiste debe tratarse con medicamentos si la prdida de peso y el ejercicio no son efectivos.  Si tiene entre 24 y 52 aos, consulte a su mdico si debe tomar aspirina para prevenir enfermedades cardacas.  Los anlisis para la diabetes incluyen la toma de [place] de sangre para controlar el nivel de azcar en la sangre durante el [place]. Debe hacerlo cada 3 aos despus de los 63 aos si est dentro de su peso normal y sin factores de riesgo para la diabetes. Las pruebas deben comenzar a edades tempranas o llevarse a cabo con ms frecuencia si tiene sobrepeso y al menos 1 factor de riesgo para la diabetes.  Las evaluaciones para Public affairs consultant de mama son un mtodo preventivo fundamental para las mujeres. Debe practicar la "autoconciencia de las mamas". Esto significa que Chief Technology Officer apariencia normal de sus mamas y [place] y pudiendo incluir un autoexamen de Glass blower/designer. Si detecta algn cambio, no importa cun pequeo sea, debe informarlo a su mdico. Las mujeres entre 20 y 62 aos deben hacer un examen clnico de las mamas como parte del examen regular de [place], cada 1 a 3 aos. Despus de los 40 aos deben Coca[employer]a. Deben hacerse una mamografa radografa de mamas ) cada ao, comenzando a los 40 aos. Las mujeres con Editor, commissioning de cncer de mama deben hablar con el mdico para hacer un estudio gentico. Las que tienen ms riesgo deben Electrical engineer resonancia magntica y [employer] todos [place].  La evaluacin del riesgo de sufrir cncer relacionado con el cncer de mama gentico (BRCA) se recomienda en aquellas mujeres que tienen familiares con cncer relacionados con el BRCA El cncer relacionado con BRCA incluye el cncer de mama, de ovario, de trompas y peritoneal. El hecho de tener familiares con estos tipos de cncer  puede asociarse con un mayor riesgo de a sufrir cambios perjudiciales (mutaciones) en los genes para el cncer de mama BRCA1 y BRCA2. Los resultados de la evaluacin determinar la necesidad de asesoramiento gentico BRCA1 y BRCA2 y [place]. Los resultados de la evaluacin determinarn la necesidad de asesoramiento gentico y estudios para el BRCA1 y BRCA2.  Un Papanicolau se realiza para diagnosticar cncer de cuello de tero. Las mujeres deben hacerse un Papanicolau a Proofreader de los 21 aos. [employer] 21 y los 29 aos debe repetirse [place]. Luego de los 30 aos, debe realizarse un Papanicolau cada tres aos siempre que los 3 estudios anteriores sean normales. Si le han realizado una histerectoma por un problema que no era cncer u otra enfermedad que podra causar cncer, ya no necesitar un Papanicolau. Si tiene entre 91 y 72 aos y ha tenido Engineer, production normal en los ltimos 10 aos, ya no ser IT sales professional. Si ha recibido un tratamiento para Science writer cervical o para una enfermedad que podra causar cncer, necesitar realizar un Papanicolau y Technical sales engineer  durante al menos 20 aos de Merchandiser, retail. Si no se ha Futures trader con regularidad, Chief Executive Officer a evaluarse los factores de riesgo (como el tener un nuevo compaero sexual) para Programmer, applications a Actuary. Algunas mujeres sufren problemas mdicos que aumentan la probabilidad de Optician, dispensing cervical. En estos casos, el mdico podr indicar que se realice el Papanicolau con ms frecuencia.  La prueba del virus del Engineer, technical sales (VPH) es un anlisis adicional que puede usarse para Film/video editor de cuello de tero. Esta prueba busca la presencia del virus que causa los cambios en el cuello. Las [employer] se recolectan durante el Papanicolau pueden usarse para el VPH. La prueba para el VPH puede usarse para Chief of Staff a mujeres de ms de 35 aos y debe usarse en mujeres de cualquier edad [employer] del Papanicolau no sean claros. Despus de los 30 aos, las mujeres deben hacerse el anlisis para el VPH con la misma frecuencia que el [place].  El cncer colorectal puede detectarse y con frecuencia puede prevenirse. La mayor parte de los estudios de rutina comienzan a los 66 aos y [employer] 56 aos. Sin embargo, el mdico podr aconsejarle que lo haga antes, si tiene factores de riesgo para el cncer de colon. Una vez por ao, el profesional le dar un kit de prueba para Hydrologist en la materia fecal. La utilizacin de un tubo con una pequea cmara en su extremo para examinar directamente el colon (sigmoidoscopa o colonoscopa), puede detectar formas temprana de cncer colorectal. Hable con su mdico si tiene 26 aos, cuando comience con los estudios de [place]. El examen directo del colon debe repetirse cada 5 a 10 aos, hasta los 75 aos, excepto que se encuentren formas tempranas de plipos precancerosos o pequeos bultos.  Se recomienda realizar un anlisis de sangre para Hydrographic surveyor hepatitis C a todas las personas nacidas entre 1945 y 1965, y a todo aquel que tenga un riesgo conocido de haber contrado esta enfermedad.  Practique el sexo seguro. Use condones y evite las prcticas sexuales riesgosas para disminuir el contagio de enfermedades de transmisin sexual. Las mujeres sexualmente activas de 25 aos o menos deben controlarse para descartar clamidia, que es una infeccin de transmisin sexual frecuente. Las [employer] que tengan mltiples compaeros tambin deben hacerse el anlisis para Hydrographic surveyor clamidia. Se recomienda realizar anlisis para detectar otras enfermedades de transmisin sexual si es sexualmente [place] y tiene riesgos.  La osteoporosis es una enfermedad en la que los huesos pierden los minerales y la fuerza por el avance de la edad. El resultado pueden ser fracturas graves en los [place]. El riesgo de osteoporosis puede identificarse con Ardelia Mems  prueba de densidad sea. Las mujeres de ms de 21 aos y las que tengan riesgos de sufrir fracturas u osteoporosis deben pedir consejo a su mdico. Consulte a su mdico si debe tomar un suplemento de calcio o de vitamina D para reducir el riesgo de osteoporosis.  La menopausia se asocia a sntomas y riesgos fsicos. Se dispone de una terapia de reemplazo hormonal para disminuir los sntomas y [place]. Consulte a su mdico para saber si la terapia de reemplazo hormonal es conveniente para usted.  Use una pantalla solar. Aplique pantalla de [place] y repetida a lo largo del Training and development officer. Pngase al resguardo del sol cuando la sombra sea ms pequea que usted. Protjase usando mangas y [employer], un sombrero de ala ancha y gafas para el  sol todo el ao, siempre que se encuentre en el exterior.  Informe a su mdico si aparecen nuevos lunares o los que tiene se modifican, especialmente en forma y color. Tambin notifique al mdico si un lunar es ms grande que el tamao de una goma de Games developer.  [place]. Document Released: 04/05/2011 Document Revised: 08/11/2012 [employer] Patient Information 2015 [place], [place]. This information is not intended to replace advice given to you by your health care provider. Make sure you discuss any questions you have with your health care provider.

## 2013-10-28 NOTE — Progress Notes (Addendum)
Patient ID: Amaree Loisel, female   DOB: November 12, 1980, 33 y.o.   MRN: [id number]  Spanish interpreter utilized for this visit.  HPI:  Patient presents today for a well woman exam.   Concerns today: periumbilical pain, jaw pain Periods: q28 days in general, gets pain in stomach and shoulders and skin around her period Contraception: none, was trying to get pregnant and previously saw GYN for infertility, not continuing to see them Pelvic symptoms: no pelvic pain unrelated to period or vaginal discharge Sexual activity: just one female partner STD Screening: no concerns for STD's, declines today Pap smear status: due for pap next year Exercise: no exercise Diet: doesn't always eat as healthy as she should  Periumbilical pain - complains of pain in her periumbilical area whenever she pushes on her stomach. She does not have the pain other times. She eats well and has normal bowel movements. No weight loss or blood in her stool. This has been a chronic complaint previously. She had one episode of slight urinary hesitancy yesterday, but has been urinating fine today. No dysuria or back pain. No fevers.  Jaw pain: Previously had orthodontic work in [place]. She has noticed that when she opens her mouth. Bottom jaw moves to the right. She also has a clunking sound. It is not acutely painful right now. Has been a chronic issue.  ROS: See HPI  [place]:  Cancers in family: none  PHYSICAL EXAM: BP 103/71  Pulse 90  Temp(Src) 98.4 F (36.9 C) (Oral)  Ht 5\' 5"  (1.651 m)  Wt 140 lb 11.2 oz (63.821 kg)  BMI 23.41 kg/m2  LMP 10/13/2013  Gen: NAD, pleasant, cooperative HEENT: NCAT, PERRL, no palpable thyromegaly or anterior cervical lymphadenopathy. Jaw deviates to the right with opening of mouth. Clunk palpable over left TMJ. nontender to palpation. Heart: RRR, no murmurs Lungs: CTAB, NWOB Abdomen: soft, nontender to palpation, no masses or organomegaly. No tenderness around umbilicus.  Normoactive bowel sounds Neuro: grossly nonfocal, speech normal   ASSESSMENT/PLAN:  # Health maintenance:  -STD screening: declines today -pap smear: due for pap next year -lipid screening: pt to return for fasting lipids and CMET -handout given on health maintenance topics  # Periumbilical pain - no concerning history. This only happens if she pushes on her abdomen. Her abdominal exam is benign today. Advised that she not rub her stomach or push on her belly to avoid the pain.  See problem based charting for additional assessment/plan.  FOLLOW UP: Return for fasting labs F/u in 1 year for routine physical.  [place] J. Ardelia Mems, [place]

## 2013-10-28 NOTE — Assessment & Plan Note (Signed)
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 No acute signs of lockjaw. No acute tenderness. She has been previously referred to the dentist. She is on the waiting list for the orange card dental list. I have advised her that this may take upwards of 6 months before she is able to be seen. She is agreeable to this wait.

## 2013-11-18 ENCOUNTER — Encounter: Payer: Self-pay | Admitting: Family Medicine

## 2013-11-18 ENCOUNTER — Ambulatory Visit (INDEPENDENT_AMBULATORY_CARE_PROVIDER_SITE_OTHER): Payer: No Typology Code available for payment source | Admitting: Family Medicine

## 2013-11-18 VITALS — BP 122/78 | HR 80 | Temp 98.2°F | Ht 65.0 in | Wt 139.7 lb

## 2013-11-18 DIAGNOSIS — N3000 Acute cystitis without hematuria: Secondary | ICD-10-CM

## 2013-11-18 DIAGNOSIS — R300 Dysuria: Secondary | ICD-10-CM

## 2013-11-18 DIAGNOSIS — R1033 Periumbilical pain: Secondary | ICD-10-CM

## 2013-11-18 DIAGNOSIS — N3001 Acute cystitis with hematuria: Secondary | ICD-10-CM

## 2013-11-18 DIAGNOSIS — Z13220 Encounter for screening for lipoid disorders: Secondary | ICD-10-CM

## 2013-11-18 LAB — POCT URINALYSIS DIPSTICK
BILIRUBIN UA: NEGATIVE
GLUCOSE UA: NEGATIVE
KETONES UA: NEGATIVE
NITRITE UA: NEGATIVE
Protein, UA: NEGATIVE
Spec Grav, UA: 1.02
Urobilinogen, UA: 0.2
pH, UA: 8

## 2013-11-18 LAB — POCT UA - MICROSCOPIC ONLY

## 2013-11-18 LAB — POCT URINE PREGNANCY: PREG TEST UR: NEGATIVE

## 2013-11-18 MED ORDER — CEPHALEXIN 500 MG PO CAPS: 500.0000 mg | ORAL_CAPSULE | Freq: Three times a day (TID) | ORAL | Status: DC

## 2013-11-18 NOTE — Addendum Note (Signed)
Addended by: [place], Toleen Lachapelle on: 11/18/2013 11:40 AM   Modules accepted: Orders

## 2013-11-18 NOTE — Patient Instructions (Addendum)
Great to meet you!  I think you have a UTI - I sent an antibiotic to your pharmacy- Keflex  I am suspicious of an umbilical hernia, we only get a ceartain number of non-emergent surgical consults a year with the orange card so we can send you in January if you come back then.   You shoud know the signs of incarceration which is the emergent situation with hernias. This is unlikely but you should go to the emergency department if it happens.    Creo que tienes una infeccin urinaria - Envi un antibitico para su Keflex Farmacia-  Yo soy sospechoso de una hernia umbilical , slo tenemos un nmero ceartain de la no -emergencia quirrgica consulta a un ao con la tarjeta naranja para que podamos enviar en enero si vuelves a continuacin .  Usted shoud conocer los signos de encarcelamiento , que es la situacin emergente con hernias. Esto es poco probable , pero hay que ir a la sala de emergencia si se produce.     Infeccin urinaria  (Urinary Tract Infection)  Una infeccin urinaria puede ocurrir en Clinical cytogeneticist del tracto urinario. El tracto [employer] riones, urteres, la vejiga y Geologist, engineering. La causa es un germen llamado bacteria. La infeccin urinaria mejora con antibiticos.  CUIDADOS EN EL HOGAR   Si le recetaron antibiticos, tmelos como le haya indicado el mdico. Tmelos todos, aunque se sienta mejor.  Beba gran cantidad de lquido para mantener el pis (orina) de tono claro o amarillo plido.  Evite el t, las bebidas con cafena y las bebidas gaseosas (carbonatada).  Orine con frecuencia. Evite retener la [employer].  Orine antes y despus de tener sexo (relaciones sexuales).  Si es [place], higiencese desde adelante hacia atrs despus de ir de cuerpo (mover el intestino). Use slo un papel tissue por vez. SOLICITE AYUDA DE INMEDIATO SI:   Siente dolor en la espalda.  Siente un dolor en el vientre (abdominal) muy intenso.  Tiene  escalofros.  Tiene Higher education careers adviser (nuseas).  Vomita.  El ardor o las molestias al orinar no desaparecen.  Tiene fiebre.  Los sntomas no mejoran despus de 3 das. ASEGRESE DE QUE:   Comprende estas instrucciones.  Controlar su enfermedad.  Solicitar ayuda de inmediato si no mejora o si empeora. Document Released: 10/04/2009 Document Revised: 01/09/2012 [facility] Patient Information 2015 [place]. This information is not intended to replace advice given to you by your health care provider. Make sure you discuss any questions you have with your health care provider.       Hernia (Hernia) Una hernia ocurre cuando un rgano interno protruye a travs de un punto debilitado de la pared del vientre (abdominal). La mayor parte de las hernias empeoran con el tiempo. Generalmente, pueden volver a Educational psychologist (reducirse). Es posible que se requiera una ciruga para reparar las hernias que no pueden colocarse en Chief of Staff. CUIDADOS EN EL HOGAR  Contine realizando las [employer].  Evite levantar objetos de ms de 10 libras (4,5kg).  Tosa suavemente y Water quality scientist. Con el tiempo, esto:  Aumentar el tamao de la hernia.  Irritar la hernia.  Romper la reparacin de la hernia.  Deje de fumar.  No use nada apretado sobre la hernia. No mantenga la hernia adentro con un vendaje externo.  Ingiera alimentos de alto contenido de fibra (frutas, verduras, cereales integrales).  Beba suficiente lquido para mantener el pis (orina) claro o de color amarillo plido.  Tome medicamentos para ablandar las heces (ablandadores de heces) si no puede defecar (est constipado). SOLICITE AYUDA DE INMEDIATO SI:   Tiene fiebre.  Siente un dolor en la zona baja del vientre que empeora.  Tiene Higher education careers adviser (nuseas) y devuelve (vomita).  La piel comienza a abultarse.  La hernia cambia de color, se endurece o le duele.  El dolor o la  inflamacin (hinchazn) alrededor de la hernia [place].  Defeca con mayor o menor frecuencia.  El aspecto de la materia fecal no es el normal.  La materia fecal es lquida (diarrea).  No puede volver a Public affairs consultant hernia en su lugar ejerciendo una presin suave mientras se encuentra recostado. ASEGRESE DE QUE:   Comprende estas instrucciones.  Controlar su afeccin.  Recibir ayuda de inmediato si no mejora o si empeora. Document Released: 02/04/2013 [facility] Patient Information 2015 [place]. This information is not intended to replace advice given to you by your health care provider. Make sure you discuss any questions you have with your health care provider.

## 2013-11-18 NOTE — Assessment & Plan Note (Signed)
U preg negative,  UA with 1+ leuk with classic symptoms, no pyelo Previously amp resistant E coli, Tx with Keflex Discussed red flags F/u as needed

## 2013-11-18 NOTE — Progress Notes (Signed)
Patient ID: Debbie Campos, female   DOB: [birth year], 33 y.o.   MRN: [id number]  [name], MD Phone: [phone]  Subjective:  Chief complaint-noted  Pt Here for her for UTI  Performed with translator.   Patient states that for the last 3 days she's had dysuria, urinary urgency, and chills. She also had low back pain for the first day of symptoms but has since resolved. She's been taking OTC AZO without much relief in symptoms.  She states that she has a normal appetite and overall feels well.  Her last period was 10 days ago, she has chronic peri- umbilical pain associated with her period every month but states this month it is lingering longer than usual.   ROS-  Positive chills No fever or sweats Normal appetite No dyspnea No chest pain positive dysuria and urinary urgency  Past Medical History Patient Active Problem List   Diagnosis Date Noted  . Umbilical pain 77/41/2878  . Jaw pain 10/19/2013  . Injury of right index finger 10/19/2013  . UTI (urinary tract infection) 01/09/2013  . 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 Female infertility 06/27/2012  . Situational anxiety 02/27/2012  . Vaginal Discharge 11/26/2011  . Infertility, female 11/21/2011  . Nonallopathic lesion 11/03/2011  . Sacro-iliac pain 11/03/2011  . Menstrual cramps 09/12/2011  . Well woman exam 09/12/2011    Medications- reviewed and updated Current Outpatient Prescriptions  Medication Sig Dispense Refill  . cephALEXin (KEFLEX) 500 MG capsule Take 1 capsule (500 mg total) by mouth 3 (three) times daily.  21 capsule  0  . dexamethasone (DECADRON) 0.5 MG tablet Take one daily from day 2 of cycle to day 18 of cycle  20 tablet  3   No current facility-administered medications for this visit.    Objective: BP 122/78  Pulse 80  Temp(Src) 98.2 F (36.8 C) (Oral)  Ht 5\' 5"  (1.651 m)  Wt 139 lb 11.2 oz (63.368 kg)  BMI 23.25 kg/m2  LMP 11/08/2013 Gen: NAD, alert,  cooperative with exam HEENT: NCAT CV: RRR, good S1/S2, no murmur Resp: CTABL, no wheezes, non-labored Abd: soft, mild tenderness periumbilical area, palpable at the umbilicus but no clear buldge or buldge with sitting up or standing  Ext: No edema, warm Neuro: Alert and oriented, No gross deficits   Assessment/Plan:  UTI (urinary tract infection) U preg negative,  UA with 1+ leuk with classic symptoms, no pyelo Previously amp resistant E coli, Tx with Keflex Discussed red flags F/u as needed   Umbilical pain Chronic umbilical pain. WIth defect on exam I am suspicious of an umbilical hernia, the pain with periods doesn't fit and there is no buldge with pressure.  Would consider CT or [place] for further work up in the future Discussed that [facility] only takes a few non emergent cases from orange card per year and we can consider sending in [place] Discussed signs of incarceration and gave red flags, ALso discussed that it is not definitely a hernia but only suspicious.  Follow up in jan or sooner if needed.     Orders Placed This Encounter  Procedures  . POCT urine pregnancy  . Urinalysis Dipstick  . POCT UA - Microscopic Only    Meds ordered this encounter  Medications  . cephALEXin (KEFLEX) 500 MG capsule    Sig: Take 1 capsule (500 mg total) by mouth 3 (three) times daily.    Dispense:  21 capsule    Refill:  0

## 2013-11-18 NOTE — Assessment & Plan Note (Signed)
Chronic umbilical pain. WIth defect on exam I am suspicious of an umbilical hernia, the pain with periods doesn't fit and there is no buldge with pressure.  Would consider CT or [place] for further work up in the future Discussed that [facility] only takes a few non emergent cases from orange card per year and we can consider sending in [place] Discussed signs of incarceration and gave red flags, ALso discussed that it is not definitely a hernia but only suspicious.  Follow up in jan or sooner if needed.

## 2013-11-19 LAB — LIPID PANEL
Cholesterol: 163 mg/dL (ref 0–200)
HDL: 40 mg/dL (ref >39)
LDL Cholesterol: 93 mg/dL (ref 0–99)
Total CHOL/HDL Ratio: 4.1 Ratio
Triglycerides: 148 mg/dL (ref <150)
VLDL: 30 mg/dL (ref 0–40)

## 2013-11-19 LAB — COMPLETE METABOLIC PANEL WITH GFR
ALT: 27 U/L (ref 0–35)
AST: 23 U/L (ref 0–37)
Albumin: 4.6 g/dL (ref 3.5–5.2)
Alkaline Phosphatase: 66 U/L (ref 39–117)
BILIRUBIN TOTAL: 0.4 mg/dL (ref 0.2–1.2)
BUN: 11 mg/dL (ref 6–23)
CO2: 25 mEq/L (ref 19–32)
Calcium: 9.1 mg/dL (ref 8.4–10.5)
Chloride: 105 mEq/L (ref 96–112)
Creat: 0.56 mg/dL (ref 0.50–1.10)
GFR, Est African American: >89 mL/min
GFR, Est Non African American: >89 mL/min
Glucose, Bld: 83 mg/dL (ref 70–99)
Potassium: 4.1 mEq/L (ref 3.5–5.3)
SODIUM: 137 meq/L (ref 135–145)
Total Protein: 7.3 g/dL (ref 6.0–8.3)

## 2013-11-20 LAB — URINE CULTURE: Colony Count: >=100000

## 2013-11-25 ENCOUNTER — Encounter: Payer: Self-pay | Admitting: Family Medicine

## 2014-02-08 ENCOUNTER — Ambulatory Visit (INDEPENDENT_AMBULATORY_CARE_PROVIDER_SITE_OTHER): Payer: Self-pay | Admitting: Gynecology

## 2014-02-08 ENCOUNTER — Encounter: Payer: Self-pay | Admitting: Gynecology

## 2014-02-08 VITALS — BP 106/60 | Ht 65.75 in | Wt 137.8 lb

## 2014-02-08 DIAGNOSIS — N979 Female infertility, unspecified: Secondary | ICD-10-CM

## 2014-02-08 DIAGNOSIS — N915 Oligomenorrhea, unspecified: Secondary | ICD-10-CM

## 2014-02-08 DIAGNOSIS — Z01419 Encounter for gynecological examination (general) (routine) without abnormal findings: Secondary | ICD-10-CM

## 2014-02-08 LAB — LIPID PANEL
CHOL/HDL RATIO: 3.9 ratio
Cholesterol: 146 mg/dL (ref 0–200)
HDL: 37 mg/dL — AB (ref >39)
LDL CALC: 75 mg/dL (ref 0–99)
TRIGLYCERIDES: 169 mg/dL — AB (ref <150)
VLDL: 34 mg/dL (ref 0–40)

## 2014-02-08 LAB — COMPREHENSIVE METABOLIC PANEL
ALBUMIN: 4.8 g/dL (ref 3.5–5.2)
ALK PHOS: 72 U/L (ref 39–117)
ALT: 21 U/L (ref 0–35)
AST: 18 U/L (ref 0–37)
BILIRUBIN TOTAL: 0.3 mg/dL (ref 0.2–1.2)
BUN: 13 mg/dL (ref 6–23)
CO2: 23 mEq/L (ref 19–32)
Calcium: 9.3 mg/dL (ref 8.4–10.5)
Chloride: 105 mEq/L (ref 96–112)
Creat: 0.6 mg/dL (ref 0.50–1.10)
Glucose, Bld: 83 mg/dL (ref 70–99)
Potassium: 3.7 mEq/L (ref 3.5–5.3)
Sodium: 139 mEq/L (ref 135–145)
Total Protein: 7.4 g/dL (ref 6.0–8.3)

## 2014-02-08 LAB — CBC WITH DIFFERENTIAL/PLATELET
Basophils Absolute: 0 10*3/uL (ref 0.0–0.1)
Basophils Relative: 0 % (ref 0–1)
EOS ABS: 0.1 10*3/uL (ref 0.0–0.7)
Eosinophils Relative: 2 % (ref 0–5)
HEMATOCRIT: 40.4 % (ref 36.0–46.0)
HEMOGLOBIN: 13.5 g/dL (ref 12.0–15.0)
Lymphocytes Relative: 37 % (ref 12–46)
Lymphs Abs: 1.8 10*3/uL (ref 0.7–4.0)
MCH: 28.1 pg (ref 26.0–34.0)
MCHC: 33.4 g/dL (ref 30.0–36.0)
MCV: 84 fL (ref 78.0–100.0)
MONO ABS: 0.3 10*3/uL (ref 0.1–1.0)
MONOS PCT: 7 % (ref 3–12)
NEUTROS ABS: 2.6 10*3/uL (ref 1.7–7.7)
Neutrophils Relative %: 54 % (ref 43–77)
Platelets: 263 10*3/uL (ref 150–400)
RBC: 4.81 MIL/uL (ref 3.87–5.11)
RDW: 14.6 % (ref 11.5–15.5)
WBC: 4.9 10*3/uL (ref 4.0–10.5)

## 2014-02-08 LAB — TSH: TSH: 1.397 u[IU]/mL (ref 0.350–4.500)

## 2014-02-08 MED ORDER — LETROZOLE 2.5 MG PO TABS: ORAL_TABLET | ORAL | Status: DC

## 2014-02-08 NOTE — Patient Instructions (Signed)
Influenza Virus Vaccine injection (Fluarix) Qu es este medicamento? La VACUNA ANTIGRIPAL ayuda a disminuir el riesgo de contraer la influenza, tambin conocida como la gripe. La vacuna solo ayuda a protegerle contra algunas cepas de influenza. Esta vacuna no ayuda a reducir Catering manager de contraer influenza pandmica H1N1. Este medicamento puede ser utilizado para otros usos; si tiene alguna pregunta consulte con su proveedor de atencin mdica o con su farmacutico. MARCAS COMERCIALES DISPONIBLES: Fluarix, Fluzone Qu le debo informar a mi profesional de la salud antes de tomar este medicamento? Necesita saber si usted presenta alguno de los siguientes problemas o situaciones: -trastorno de sangrado como hemofilia -fiebre o infeccin -sndrome de Guillain-Barre u otros problemas neurolgicos -problemas del sistema inmunolgico -infeccin por el virus de la inmunodeficiencia humana (VIH) o SIDA -niveles bajos de plaquetas en la sangre -esclerosis mltiple -una Risk analyst o inusual a las vacunas antigripales, a los huevos, protenas de pollo, al ltex, a la gentamicina, a otros medicamentos, alimentos, colorantes o conservantes -si est embarazada o buscando quedar embarazada -si est amamantando a un beb Cmo debo utilizar este medicamento? Esta vacuna se administra mediante inyeccin por va intramuscular. Lo administra un profesional de [employer]	[place]. Recibir una copia de informacin escrita sobre la vacuna antes de cada vacuna. Asegrese de leer este folleto cada vez cuidadosamente. Este folleto puede cambiar con frecuencia. Hable con su pediatra para informarse acerca del uso de este medicamento en nios. Puede requerir atencin especial. Sobredosis: Pngase en contacto inmediatamente con un centro toxicolgico o una sala de urgencia si usted cree que haya tomado demasiado medicamento. ATENCIN: [employer] es solo para usted. No comparta este medicamento con nadie. Qu sucede  si me olvido de una dosis? No se aplica en este caso. Qu puede interactuar con este medicamento? -quimioterapia o radioterapia -medicamentos que suprimen el sistema inmunolgico, tales como etanercept, anakinra, infliximab y adalimumab -medicamentos que tratan o previenen cogulos sanguneos, como warfarina -fenitona -medicamentos esteroideos, como la prednisona o la cortisona -teofilina -vacunas Puede ser que esta lista no menciona todas las posibles interacciones. Informe a su profesional de [employer]	[place] de [employer] productos a base de hierbas, medicamentos de [employer] [place] o suplementos nutritivos que est tomando. Si usted fuma, consume bebidas alcohlicas o si utiliza drogas ilegales, indqueselo tambin a su profesional de [employer]	[place]. Algunas sustancias pueden interactuar con su medicamento. A qu debo estar atento al usar [employer]? Informe a su mdico o a Barrister's clerk de la [employer] todos los efectos secundarios que persistan despus de 3 das. Llame a su proveedor de atencin mdica si se presentan sntomas inusuales dentro de las 6 semanas posteriores a la vacunacin. Es posible que todava pueda contraer la gripe, pero la enfermedad no ser tan fuerte como normalmente. No puede contraer la gripe de esta vacuna. La vacuna antigripal no le protege contra resfros u otras enfermedades que pueden causar [place]. Debe vacunarse cada ao. Qu efectos secundarios puedo tener al [employer] este medicamento? Efectos secundarios que debe informar a su mdico o a Barrister's clerk de la salud tan pronto como sea posible: -reacciones alrgicas como erupcin cutnea, picazn o urticarias, hinchazn de la cara, labios o lengua Efectos secundarios que, por lo general, no requieren atencin mdica (debe informarlos a su mdico o a su profesional de la salud si persisten o si son molestos): -fiebre -dolor de cabeza -molestias y dolores musculares -dolor, sensibilidad, enrojecimiento o Database administrator de la inyeccin -cansancio o debilidad Puede ser que [employer]  no menciona todos los posibles efectos secundarios. Comunquese a su mdico por asesoramiento mdico [employer]. Usted puede informar los efectos secundarios a la FDA por telfono al 1-800-FDA-1088. Dnde debo guardar mi medicina? Esta vacuna se administra solamente en clnicas, farmacias, consultorio mdico u otro consultorio de un profesional de la salud y no Sports coach en su domicilio. ATENCIN: Este folleto es un resumen. Puede ser que no cubra toda la posible informacin. Si usted tiene preguntas acerca de esta medicina, consulte con su mdico, su farmacutico o su profesional de Technical sales engineer.  2015, Elsevier/Gold Standard. (2009-10-18 15:31:40)

## 2014-02-08 NOTE — Progress Notes (Signed)
Debbie Campos 1981/02/02 [id number]   History:    33 y.o.  who presented to the office for her annual gynecological examination. Patient with long-standing history of primary infertility see previous notes outlining her workup as well as treatment. She had gone through clomiphene citrate along with dexamethasone and timing of intercourse for 3 months and was unsuccessful in conceiving. She eventually was referred to the reproductive endocrinologist who had done pre-stimulated cycles with intrauterine semination and patient did not conceive. Patient cannot afford in vitro fertilization. Patient has had history of oligomenorrhea in the past. She's had a normal TSH and prolactin is in the past. She reports her cycles range between every 28 to-35 days. Patient not interested in the flu vaccine today. Patient reports no past history of abnormal Pap smears.Patient's husband recent semen analysis Low number of normal forms otherwise normal semen analysis.   Past medical history,surgical history, family history and social history were all reviewed and documented in the EPIC chart.  Gynecologic History Patient's last menstrual period was 02/01/2014. Contraception: none Last Pap: 2013. Results were: normal Last mammogram: Not indicated. Results were: normal  Obstetric History OB History  Gravida Para Term Preterm AB SAB TAB Ectopic Multiple Living  0                  ROS: A ROS was performed and pertinent positives and negatives are included in the history.  GENERAL: No fevers or chills. HEENT: No change in vision, no earache, sore throat or sinus congestion. NECK: No pain or stiffness. CARDIOVASCULAR: No chest pain or pressure. No palpitations. PULMONARY: No shortness of breath, cough or wheeze. GASTROINTESTINAL: No abdominal pain, nausea, vomiting or diarrhea, melena or bright red blood per rectum. GENITOURINARY: No urinary frequency, urgency, hesitancy or dysuria. MUSCULOSKELETAL: No  joint or muscle pain, no back pain, no recent trauma. DERMATOLOGIC: No rash, no itching, no lesions. ENDOCRINE: No polyuria, polydipsia, no heat or cold intolerance. No recent change in weight. HEMATOLOGICAL: No anemia or easy bruising or bleeding. NEUROLOGIC: No headache, seizures, numbness, tingling or weakness. PSYCHIATRIC: No depression, no loss of interest in normal activity or change in sleep pattern.     Exam: chaperone present  BP 106/60  Ht 5' 5.75" (1.67 m)  Wt 137 lb 12.8 oz (62.506 kg)  BMI 22.41 kg/m2  LMP 02/01/2014  Body mass index is 22.41 kg/(m^2).  General appearance : Well developed well nourished female. No acute distress HEENT: Neck supple, trachea midline, no carotid bruits, no thyroidmegaly Lungs: Clear to auscultation, no rhonchi or wheezes, or rib retractions  Heart: Regular rate and rhythm, no murmurs or gallops Breast:Examined in sitting and supine position were symmetrical in appearance, no palpable masses or tenderness,  no skin retraction, no nipple inversion, no nipple discharge, no skin discoloration, no axillary or supraclavicular lymphadenopathy Abdomen: no palpable masses or tenderness, no rebound or guarding Extremities: no edema or skin discoloration or tenderness  Pelvic:  Bartholin, Urethra, Skene Glands: Within normal limits             Vagina: No gross lesions or discharge  Cervix: No gross lesions or discharge  Uterus  anteverted, normal size, shape and consistency, non-tender and mobile  Adnexa  Without masses or tenderness  Anus and perineum  normal   Rectovaginal  normal sphincter tone without palpated masses or tenderness             Hemoccult that indicated     Assessment/Plan:  33 y.o. female for  annual exam with long-standing history of primary infertility. Patient cannot afford in vitro fertilization. We'll try a different ovulation induction medication such as with Femera 2.5 mg from day 3-7. Patient will use ovulation indicative of  a 10-16 to time or intercourse. She will try this for 3 months if she is not successful in getting pregnant once again I recommend she follow up with a reproductive endocrinologist and seriously consider in vitro fertilization. She was reminded of the importance of monthly breast exam. Pap smear not indicated at this time. She had the following fashion lab work: Fasting lipid profile, comprehensive metabolic panel, TSH, CBC, and urinalysis. Patient was reminded also to stay with her prenatal vitamins 1 daily. Discussed the risks benefits and pros and cons of ovulation induction medication to include hyperstimulation of the ovaries as well as multiple gestation. All this information was provided in [place].   Terrance Mass MD, 8:56 AM 02/08/2014

## 2014-02-09 ENCOUNTER — Telehealth: Payer: Self-pay

## 2014-02-09 ENCOUNTER — Other Ambulatory Visit: Payer: Self-pay | Admitting: Gynecology

## 2014-02-09 LAB — URINALYSIS W MICROSCOPIC + REFLEX CULTURE
BACTERIA UA: NONE SEEN
BILIRUBIN URINE: NEGATIVE
CRYSTALS: NONE SEEN
Casts: NONE SEEN
Glucose, UA: NEGATIVE mg/dL
Hgb urine dipstick: NEGATIVE
KETONES UR: NEGATIVE mg/dL
Leukocytes, UA: NEGATIVE
Nitrite: NEGATIVE
Protein, ur: NEGATIVE mg/dL
SPECIFIC GRAVITY, URINE: 1.024 (ref 1.005–1.030)
UROBILINOGEN UA: 0.2 mg/dL (ref 0.0–1.0)
pH: 6 (ref 5.0–8.0)

## 2014-02-09 NOTE — Telephone Encounter (Signed)
Pharmacist notified.

## 2014-02-09 NOTE — Telephone Encounter (Signed)
This pharmacist needs to be educated because Debbie Campos is also used for ovulation induction medication 2.5 mg for 5 days starting with day 3 of her cycle. Yes I am aware of this medication

## 2014-02-09 NOTE — Telephone Encounter (Signed)
Pharmacist called regarding Rx for Femara for patient. He said it the medication has a "Black Label Warning" on it because of patient's age and her being premenopausal.   He just has to make you aware of it before he fills it for her.

## 2014-02-15 ENCOUNTER — Ambulatory Visit: Payer: Self-pay | Admitting: Gynecology

## 2014-12-28 ENCOUNTER — Ambulatory Visit (INDEPENDENT_AMBULATORY_CARE_PROVIDER_SITE_OTHER): Payer: Self-pay

## 2014-12-28 ENCOUNTER — Ambulatory Visit (INDEPENDENT_AMBULATORY_CARE_PROVIDER_SITE_OTHER): Payer: Self-pay | Admitting: Family Medicine

## 2014-12-28 VITALS — BP 114/70 | HR 81 | Temp 98.3°F | Resp 16 | Ht 66.0 in | Wt 136.5 lb

## 2014-12-28 DIAGNOSIS — R070 Pain in throat: Secondary | ICD-10-CM

## 2014-12-28 LAB — POCT CBC
GRANULOCYTE PERCENT: 52.9 % (ref 37–80)
HEMATOCRIT: 37.2 % — AB (ref 37.7–47.9)
Hemoglobin: 12.2 g/dL (ref 12.2–16.2)
Lymph, poc: 2.8 (ref 0.6–3.4)
MCH: 28.1 pg (ref 27–31.2)
MCHC: 32.8 g/dL (ref 31.8–35.4)
MCV: 85.7 fL (ref 80–97)
MID (CBC): 0.5 (ref 0–0.9)
MPV: 6.5 fL (ref 0–99.8)
POC GRANULOCYTE: 3.7 (ref 2–6.9)
POC LYMPH PERCENT: 40.6 %L (ref 10–50)
POC MID %: 6.5 %M (ref 0–12)
Platelet Count, POC: 256 10*3/uL (ref 142–424)
RBC: 4.33 M/uL (ref 4.04–5.48)
RDW, POC: 13.3 %
WBC: 7 10*3/uL (ref 4.6–10.2)

## 2014-12-28 MED ORDER — IBUPROFEN 600 MG PO TABS: 600.0000 mg | ORAL_TABLET | Freq: Four times a day (QID) | ORAL | Status: DC | PRN

## 2014-12-28 NOTE — Progress Notes (Signed)
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  SUBJECTIVE  Debbie Campos is a 34 y.o. well appearing female presenting for the chief complaint of left sided tonsillary pain that started 3 days ago.  Reports the pain is worse with swallowing and better with ibuprofen.  She feels there is swelling there and is concerned despite being able to swallow liquids and solids.      She  has a past medical history of Environmental allergies; Bell's palsy (2011); and Infertility, female.    Medications reviewed and updated by myself where necessary, and exist elsewhere in the encounter.   Debbie Campos has No Known Allergies. She  reports that she has never smoked. She has never used smokeless tobacco. She reports that she does not drink alcohol or use illicit drugs. She  reports that she currently engages in sexual activity. She reports using the following method of birth control/protection: None. The patient  has past surgical history that includes none.  Her family history includes Diabetes in her paternal grandmother.  Review of Systems  Constitutional: Negative for fever and chills.  HENT: Positive for sore throat.   Respiratory: Negative for cough and shortness of breath.   Cardiovascular: Negative for chest pain.  Gastrointestinal: Negative for nausea.  Skin: Negative for itching and rash.  Neurological: Negative for dizziness and headaches.    OBJECTIVE  Her  height is 5\' 6"  (1.676 m) and weight is 136 lb 8 oz (61.916 kg). Her oral temperature is 98.3 F (36.8 C). Her blood pressure is 114/70 and her pulse is 81. Her  respiration is 16 and oxygen saturation is 98%.  The patient's body mass index is 22.04 kg/(m^2).  Physical Exam  Constitutional: She is oriented to person, place, and time. She appears well-developed and well-nourished. No distress.  HENT:  Right Ear: Hearing and tympanic membrane normal.  Left Ear: Hearing and tympanic membrane normal.  Nose: Nose normal.  Mouth/Throat: Uvula is midline, oropharynx is clear and moist and mucous membranes are normal. No oropharyngeal exudate, posterior oropharyngeal edema, posterior oropharyngeal erythema or tonsillar abscesses.  Eyes: Pupils are equal, round, and reactive to light.  Cardiovascular: Normal rate and regular rhythm.   Respiratory: Effort normal. No respiratory distress.  GI: She exhibits no distension.  Neurological: She is alert and oriented to person, place, and time.  Skin: Skin is warm and dry. She is not diaphoretic.  Psychiatric: She has a normal mood and affect. Her behavior is normal. Judgment and thought content normal.    Results for orders placed or performed in visit on 12/28/14 (from the past 24 hour(s))  POCT CBC     Status: Abnormal   Collection Time: 12/28/14  7:12 PM  Result Value Ref Range   WBC 7.0 4.6 - 10.2 K/uL   Lymph, poc 2.8 0.6 - 3.4   POC LYMPH PERCENT 40.6 10 - 50 %L   MID (cbc) 0.5 0 - 0.9   POC MID % 6.5 0 - 12 %M   POC Granulocyte 3.7 2 - 6.9   Granulocyte percent 52.9 37 - 80 %G   RBC 4.33 4.04 - 5.48 M/uL   Hemoglobin 12.2 12.2 - 16.2 g/dL   HCT, POC  37.2 (A) 37.7 - 47.9 %   MCV 85.7 80 - 97 fL   MCH, POC 28.1 27 - 31.2 pg   MCHC 32.8 31.8 - 35.4 g/dL   RDW, POC 13.3 %   Platelet Count, POC 256 142 - 424 K/uL   MPV 6.5 0 - 99.8 fL   [facility] reading (PRIMARY) by  Dr. Linna Darner: Normal neck.    [place] was seen today for sore throat.  Diagnoses and all orders for this visit:  Throat pain: Work up reassuring.  Patient reassured.  -     DG Neck Soft Tissue; Future -     POCT  CBC -     ibuprofen (ADVIL,MOTRIN) 600 MG tablet; Take 1 tablet (600 mg total) by mouth every 6 (six) hours as needed.    The patient was advised to call or come back to clinic if she does not see an improvement in symptoms, or worsens with the above plan.   Philis Fendt, MHS, PA-C [facility] 12/29/2014 8:47 AM

## 2014-12-28 NOTE — Patient Instructions (Signed)
Tomar 600 mg de ibuprofeno cada 8 horas para el dolor.

## 2014-12-29 NOTE — Progress Notes (Signed)
Patient discussed with Philis Fendt [facility]. X-ray evaluated and is normal. Treatment plan agreed upon.  Posey Boyer M.D.

## 2016-03-15 ENCOUNTER — Ambulatory Visit (INDEPENDENT_AMBULATORY_CARE_PROVIDER_SITE_OTHER): Payer: Self-pay | Admitting: Gynecology

## 2016-03-15 ENCOUNTER — Encounter: Payer: Self-pay | Admitting: Gynecology

## 2016-03-15 VITALS — BP 128/86 | Ht 65.5 in | Wt 138.0 lb

## 2016-03-15 DIAGNOSIS — Z01419 Encounter for gynecological examination (general) (routine) without abnormal findings: Secondary | ICD-10-CM

## 2016-03-15 DIAGNOSIS — Z1151 Encounter for screening for human papillomavirus (HPV): Secondary | ICD-10-CM

## 2016-03-15 DIAGNOSIS — N979 Female infertility, unspecified: Secondary | ICD-10-CM

## 2016-03-15 LAB — COMPREHENSIVE METABOLIC PANEL
ALK PHOS: 68 U/L (ref 33–115)
ALT: 16 U/L (ref 6–29)
AST: 19 U/L (ref 10–30)
Albumin: 4.5 g/dL (ref 3.6–5.1)
BUN: 14 mg/dL (ref 7–25)
CO2: 25 mmol/L (ref 20–31)
Calcium: 9.1 mg/dL (ref 8.6–10.2)
Chloride: 105 mmol/L (ref 98–110)
Creat: 0.55 mg/dL (ref 0.50–1.10)
Glucose, Bld: 88 mg/dL (ref 65–99)
POTASSIUM: 4.1 mmol/L (ref 3.5–5.3)
Sodium: 137 mmol/L (ref 135–146)
Total Bilirubin: 0.5 mg/dL (ref 0.2–1.2)
Total Protein: 6.8 g/dL (ref 6.1–8.1)

## 2016-03-15 LAB — CBC WITH DIFFERENTIAL/PLATELET
Basophils Absolute: 42 cells/uL (ref 0–200)
Basophils Relative: 1 %
Eosinophils Absolute: 84 cells/uL (ref 15–500)
Eosinophils Relative: 2 %
HEMATOCRIT: 36.7 % (ref 35.0–45.0)
Hemoglobin: 12.4 g/dL (ref 11.7–15.5)
Lymphocytes Relative: 44 %
Lymphs Abs: 1848 cells/uL (ref 850–3900)
MCH: 28.7 pg (ref 27.0–33.0)
MCHC: 33.8 g/dL (ref 32.0–36.0)
MCV: 85 fL (ref 80.0–100.0)
MONO ABS: 378 {cells}/uL (ref 200–950)
MONOS PCT: 9 %
MPV: 8.5 fL (ref 7.5–12.5)
NEUTROS PCT: 44 %
Neutro Abs: 1848 cells/uL (ref 1500–7800)
PLATELETS: 237 10*3/uL (ref 140–400)
RBC: 4.32 MIL/uL (ref 3.80–5.10)
RDW: 14.3 % (ref 11.0–15.0)
WBC: 4.2 10*3/uL (ref 3.8–10.8)

## 2016-03-15 LAB — LIPID PANEL
CHOL/HDL RATIO: 3.7 ratio (ref <5.0)
Cholesterol: 162 mg/dL (ref <200)
HDL: 44 mg/dL — AB (ref >50)
LDL Cholesterol: 103 mg/dL — ABNORMAL HIGH (ref <100)
Triglycerides: 73 mg/dL (ref <150)
VLDL: 15 mg/dL (ref <30)

## 2016-03-15 LAB — TSH: TSH: 1.1 mIU/L

## 2016-03-15 MED ORDER — LETROZOLE 2.5 MG PO TABS: ORAL_TABLET | ORAL | 2 refills | Status: DC

## 2016-03-15 NOTE — Addendum Note (Signed)
Addended by: Thurnell Garbe A on: 03/15/2016 09:08 AM   Modules accepted: Orders

## 2016-03-15 NOTE — Progress Notes (Signed)
Debbie Campos 01-31-1981 [id number]   History:    35 y.o.  for annual gyn exam who has not been seen the office since 2015. Patient with past history of primary infertility see previous notes for detail of workup and treatment.She had gone through clomiphene citrate along with dexamethasone and timing of intercourse for 3 months and was unsuccessful in conceiving. She eventually was referred to the reproductive endocrinologist who had done pre-stimulated cycles with intrauterine semination and patient did not conceive. Patient cannot afford in vitro fertilization. Patient has had history of oligomenorrhea in the past. She's had a normal TSH and prolactin is in the past. She also had been placed on Femera  2.5 mg from day 3 through 7 with timing of intercourse using ovulation predictor Kit  she does describe that she did notice change but did not conceive. She would like to go through 3 more rounds because she cannot afford in vitro fertilization. Patient with no past history of any abnormal Pap smear. She declined flu vaccine today.Patient's husband recent semen analysis Low number of normal forms otherwise normal semen analysis.  Past medical history,surgical history, family history and social history were all reviewed and documented in the EPIC chart.  Gynecologic History Patient's last menstrual period was 03/05/2016. Contraception: none Last Pap: 2013. Results were: normal Last mammogram: Not indicated. Results were: Not indicated  Obstetric History OB History  Gravida Para Term Preterm AB Living  0            SAB TAB Ectopic Multiple Live Births                    ROS: A ROS was performed and pertinent positives and negatives are included in the history.  GENERAL: No fevers or chills. HEENT: No change in vision, no earache, sore throat or sinus congestion. NECK: No pain or stiffness. CARDIOVASCULAR: No chest pain or pressure. No palpitations. PULMONARY: No shortness of  breath, cough or wheeze. GASTROINTESTINAL: No abdominal pain, nausea, vomiting or diarrhea, melena or bright red blood per rectum. GENITOURINARY: No urinary frequency, urgency, hesitancy or dysuria. MUSCULOSKELETAL: No joint or muscle pain, no back pain, no recent trauma. DERMATOLOGIC: No rash, no itching, no lesions. ENDOCRINE: No polyuria, polydipsia, no heat or cold intolerance. No recent change in weight. HEMATOLOGICAL: No anemia or easy bruising or bleeding. NEUROLOGIC: No headache, seizures, numbness, tingling or weakness. PSYCHIATRIC: No depression, no loss of interest in normal activity or change in sleep pattern.     Exam: chaperone present  BP 128/86   Ht 5' 5.5" (1.664 m)   Wt 138 lb (62.6 kg)   LMP 03/05/2016   BMI 22.62 kg/m   Body mass index is 22.62 kg/m.  General appearance : Well developed well nourished female. No acute distress HEENT: Eyes: no retinal hemorrhage or exudates,  Neck supple, trachea midline, no carotid bruits, no thyroidmegaly Lungs: Clear to auscultation, no rhonchi or wheezes, or rib retractions  Heart: Regular rate and rhythm, no murmurs or gallops Breast:Examined in sitting and supine position were symmetrical in appearance, no palpable masses or tenderness,  no skin retraction, no nipple inversion, no nipple discharge, no skin discoloration, no axillary or supraclavicular lymphadenopathy Abdomen: no palpable masses or tenderness, no rebound or guarding Extremities: no edema or skin discoloration or tenderness  Pelvic:  Bartholin, Urethra, Skene Glands: Within normal limits             Vagina: No gross lesions or discharge  Cervix: No  gross lesions or discharge  Uterus  anteverted, normal size, shape and consistency, non-tender and mobile  Adnexa  Without masses or tenderness  Anus and perineum  normal   Rectovaginal  normal sphincter tone without palpated masses or tenderness             Hemoccult not indicated     Assessment/Plan:  35 y.o.  female for annual exam with long-standing history of primary infertility has described above. Once again it was recommended that she consider in vitro fertilization because of her age and failed ovulation induction and timing of intercourse. We'll give her 3 month trial once again with the Femera 2.5 mg with a 3-7 and timing of intercourse using the ovulation predictor kit from days 12 through 16. The risks benefits and pros and cons of the medication were discussed to include hyperstimulation multiple gestation. Instructions were provided in Spanish and written format. Pap smear with HPV screening done today. She was reminded to begin taking her daily prenatal vitamins for neural tube defect prophylaxis.   Terrance Mass MD, 8:41 AM 03/15/2016

## 2016-03-15 NOTE — Patient Instructions (Signed)
Fertilizacin in vitro  (In Vitro Fertilization) La fertilizacin in vitro (FIV) es un tipo de Chartered certified accountant aplicada a la reproduccin asistida. La FIV consiste en una serie de procedimientos para tratar la infertilidad o problemas genticos con el objeto de prestar asistencia para la concepcin de un beb. Durante la FIV, los vulos se recuperan de los ovarios y se combinan con los espermatozoides en el laboratorio para fertilizarlos. Uno o ms vulos fertilizados (embriones) se insertan en el tero a travs del cuello uterino. Las candidatas para la FIV son:  Personas que sufren infertilidad.  Las mujeres que sufren una menopausia prematura o una falla ovrica.  Las mujeres a las que se les han extirpado ambos ovarios. En este caso, deber usarse una donante de vulos.  Mujeres que tienen daadas u obstruidas las trompas de Falopio No hay lmite de edad para la FIV, pero no se recomienda para las mujeres postmenopusicas. La edad ideal para este procedimiento es de 80 aos o menos. A las mujeres de ms de 41 aos generalmente se les aconseja utilizar una donante de vulos durante la FIV para aumentar las probabilidades de [place]. INFORME A SU MDICO:   Cualquier alergia que tenga.  Todos los [employer] [place], incluyendo vitaminas, hierbas, gotas oftlmicas, cremas y medicamentos de venta libre.  Problemas previos que usted o los [employer] de su familia hayan tenido con el uso de anestsicos.  Todo problema mdico o gentico que usted o los miembros de su familia hayan sufrido.  Enfermedades de [employer].  Cirugas previas.  Embarazos previos.  Padecimientos mdicos.  Excesos en el consumo de alcohol o de tabaco.  Historia de consumo de drogas. RIESGOS Y COMPLICACIONES  Generalmente, el procedimiento de FIV es un procedimiento seguro. Sin embargo, Games developer procedimiento, pueden surgir complicaciones. Las complicaciones posibles son:  Almon Hercules o  infeccin.  Problemas con la anestesia.  Cogulos sanguneos.  El procedimiento no tiene xito.  Tener gemelos o embarazos mltiples  Aumento del riesgo de [place]. ANTES DEL PROCEDIMIENTO  Antes de comenzar el ciclo de FIV, usted y el donante sern sometidos a estudios para asegurarse de que la FIV es la mejor opcin. Algunas personas pueden no beneficiarse con este tipo de tecnologa para la asistencia reproductiva.   Usted y Transport planner tendrn que proporcionar una historia clnica completa y la historia clnica de sus familiares.  Usted y Transport planner sern sometidos a un examen fsico.  Usted y el donante podrn necesitar realizarse anlisis de sangre para detectar enfermedades infecciosas, incluyendo el VIH.  Podrn solicitarle otros estudios, por ejemplo:  Estudio de los ovarios para determinar la calidad y la cantidad de vulos.  Estudios hormonales y de ovulacin.  Un examen del tero. Este se realiza [employer] de un tipo de Magazine features editor, despus de Tour manager un lquido en el tero a travs del cuello uterino (ecohisterografa o utilizando un tubo delgado y flexible, con Ardelia Mems pequea luz y cmara en un extremo (histeroscopio).  La esperma del donante ser tomada y [place] para ver si es normal, hay cantidad suficiente para fertilizar el vulo y que actan normalmente despus de las relaciones sexuales (examen postcoital ). PROCEDIMIENTO  La FIV consiste en varios pasos. Estos procedimientos pueden Associate Professor mdico o en una clnica. Un ciclo de FIV puede llevar alrededor de 2 semanas, y podr requerirse ms de un ciclo. Los pasos de la FIV son:  IT consultant. Si se utilizan sus vulos durante la FIV, al comienzo del ciclo  comenzar un tratamiento con hormonas artificiales (sintticas. Estas hormonas estimulan los ovarios para producir mltiples vulos, a diferencia del vulo nico que normalmente se desarrolla cada mes. Es necesario obtener  mltiples vulos debido a que algunos de ellos no se fertilizarn o no se desarrollarn normalmente despus de Ambulance person.  Extraccin de vulos. Utilizando imgenes radiogrficas como gua, el medico insertar una aguja fina a travs de la vagina y IT trainer a los ovarios y Marine scientist (folculos) que contienen los vulos La aguja se Geophysical data processor a un dispositivo de succin, que retira los vulos y el lquido de cada folculo, uno por vez. El procedimiento se repite para el otro ovario.  Inseminacin y fertilizacin. El esperma se une a los ovarios (inseminacin) y se Radiographer, therapeutic en una cmara que tiene un ambiente controlado. Generalmente el esperma ingresa (fertiliza) el vulo unas horas despus de la inseminacin.  Transferencia embrionaria. El Investment banker, operational los embriones en su tero usando un tubo delgado (catter) que contiene los embriones. El catter se inserta en la vagina a travs del cuello uterino, y de all al tero. La transferencia de vulos generalmente ocurre entre 2 a 6 das luego de la extraccin de los vulos. Si hay xito, el embrin se adherir (implante) en la superficie interna del tero alrededor de 6 a 10 das luego de la extraccin de los vulos. Si se implanta el embrin en la membrana que cubre el tero y se desarrolla, resulta en un embarazo. DESPUS DEL PROCEDIMIENTO   Tendr que permanecer acostada durante una hora o ms, antes de volver a su casa.  Ser necesario que siga con la terapia hormonal por 3 meses aproximadamente, o segn lo que le indique su mdico.  Puede retomar su dieta y las actividades habituales. Esta informacin no tiene Marine scientist el consejo del mdico. Asegrese de hacerle al mdico cualquier pregunta que tenga. Document Released: 12/17/2012 Elsevier Interactive Patient Education  2017 [place] (Infertility) La infertilidad es no poder lograr el Media planner (concebir) despus de un ao de Theatre manager relaciones sexuales  regularmente sin usar ningn mtodo de control de la natalidad. La infertilidad tambin puede significar que una mujer no puede llevar el embarazo a trmino. Tanto hombres como mujeres pueden tener problemas de fertilidad. CULES SON LAS CAUSAS DE LA INFERTILIDAD? Cules son las causas de la infertilidad en las mujeres?  Hay muchas causas posibles de infertilidad en las mujeres. En algunas mujeres, no hay ninguna causa aparente de infertilidad (infertilidad idioptica). La infertilidad tambin puede estar vinculada a ms de [place]. Los problemas de infertilidad en las mujeres pueden deberse a problemas en el ciclo menstrual o los rganos reproductivos, ciertas afecciones mdicas y factores relacionados con la edad y el estilo de vida.  Los problemas en el ciclo menstrual pueden interferir con los ovarios que producen los vulos (ovulacin). Esto puede causar dificultad para quedar embarazada e incluye tener un ciclo menstrual muy largo, muy corto o irregular.  Los problemas relacionados con los rganos reproductivos incluyen lo siguiente:  Un cuello de tero anormalmente angosto o que no permanece cerrado durante el [place].  Una obstruccin en las trompas de [place].  Un tero de forma anormal.  Fibromas uterinos. Estos son masas de tejido (tumores) que pueden Therapist, art.  Las afecciones mdicas que pueden afectar la fertilidad en las mujeres incluyen lo siguiente:  Sndrome de ovario poliqustico (SOP). Este es un trastorno hormonal que produce pequeos [employer] ovarios y es la causa ms frecuente de  infertilidad en las mujeres.  Endometriosis. Es una enfermedad en la que el tejido que recubre el tero (endometrio) crece fuera de su ubicacin normal.  Insuficiencia ovrica primaria. Esto es cuando los ovarios dejan de producir vulos y hormonas antes de los 30aos de [place].  Enfermedades de transmisin sexual (ETS), como la clamidia o la gonorrea. Estas  infecciones pueden provocar fibrosis en las trompas de Falopio, lo que disminuye la probabilidad de que los vulos lleguen al tero.  Trastornos autoinmunitarios. Estas son afecciones en las que el sistema inmunitario ataca las clulas normales y saludables.  Desequilibrios hormonales.  Otros factores incluyen los siguientes:  La edad. La fertilidad en las mujeres declina con la edad, especialmente despus de los 35aos.  Tener bajo peso o sobrepeso.  Beber alcohol en exceso.  Consumir drogas.  Hacer ejercicio en exceso.  La exposicin a toxinas ambientales, como la radiacin, los plaguicidas y ciertos qumicos. Cules son las causas de la infertilidad en los hombres?  Hay muchas causas de infertilidad en los hombres. La infertilidad puede estar vinculada a ms de [employer]. Los problemas de infertilidad en los hombres pueden deberse a problemas con los espermatozoides o los rganos reproductivos, ciertas afecciones mdicas y factores relacionados con la edad y el estilo de vida. Algunos hombres tienen infertilidad idioptica.  Problemas con los espermatozoides. La infertilidad puede ser consecuencia de un problema para producir lo siguiente:  Suficientes espermatozoides (bajo recuento de espermatozoides).  Suficientes espermatozoides con forma normal (morfologa de los espermatozoides).  Espermatozoides que puedan llegar al vulo (motilidad deficiente).  Las causas de la infertilidad tambin incluyen las siguientes:  Un problema con las hormonas.  Venas agrandadas (varicocele), quistes (espermatocele) o tumores en los testculos.  Disfuncin sexual.  Ardelia Mems lesin en los testculos.  Un defecto de nacimiento, como no tener los conductos que transportan los espermatozoides (conductos deferentes).  Algunas de las afecciones mdicas que pueden afectar la fertilidad en los hombres son las siguientes:  Diabetes.  Tratamiento Social worker, como la radioterapia o la  quimioterapia.  El sndrome de [place]linefel[place]. Este es un trastorno gentico hereditario.  Problemas de tiroides, como una tiroides hipoactiva o hiperactiva.  Fibrosis qustica.  Enfermedades de transmisin sexual.  Otros factores incluyen los siguientes:  La edad. La fertilidad en los hombres declina con la edad.  Beber alcohol en exceso.  Consumir drogas.  La exposicin a toxinas ambientales, como la radiacin, los plaguicidas y el plomo. CULES SON LOS SNTOMAS DE LA INFERTILIDAD? El nico signo de infertilidad es no poder Retail buyer despus de un ao de Theatre manager relaciones sexuales regularmente sin usar ningn mtodo de control de la natalidad. CMO SE DIAGNOSTICA LA INFERTILIDAD? Para recibir un diagnstico de infertilidad, ambos integrantes de la pareja se harn a un examen fsico. Tambin se analizar en detalle la historia clnica y sexual de ambos. Si no hay una razn evidente de infertilidad, se harn estudios adicionales. Qu estudios se harn las mujeres?  Las mujeres primero pueden hacerse estudios para controlar si ovulan todos los meses. Estos estudios pueden incluir lo siguiente:  Anlisis de sangre para [employer] niveles hormonales.  Una ecografa de los ovarios. Este estudio detecta posibles problemas en los ovarios.  Toma de [place] de tejido que recubre el tero para examinarla en el microscopio (biopsia de endometrio). Las mujeres que ovulan pueden realizarse estudios adicionales. Estos pueden incluir los siguientes:  Histerosalpingografa.  Es una radiografa de las trompas de Falopio y el tero despus de Tour manager  un tipo especfico de sustancia de contraste.  Esta prueba puede mostrar la forma del tero y si las trompas de Falopio estn abiertas.  Laparoscopia.  En [employer], se [place] un tubo que emite luz (laparoscopio) para Education officer, environmental en las trompas de Falopio y otros rganos femeninos.  Ecografa  transvaginal.  Este es un estudio de diagnstico por imgenes que determina si hay anomalas en el tero y los ovarios.  El mdico puede utilizar este estudio para contar la cantidad de [employer] ovarios.  Histeroscopa.  En [employer], se [place] un tubo que emite luz para examinar el cuello y el interior del tero.  Se realiza para Airline pilot en el interior del tero. Qu estudios se harn los hombres?  Los estudios para Office manager infertilidad en los hombres incluyen los siguientes:  Anlisis de semen para controlar el recuento, la morfologa y la motilidad de los espermatozoides.  Anlisis de sangre para [employer].  Toma de [place] de tejido del interior de un testculo (biopsia). La muestra se examina en el microscopio.  Anlisis de sangre para Airline pilot genticas (pruebas genticas). CUL ES EL TRATAMIENTO PARA LA INFERTILIDAD EN LAS MUJERES? El tratamiento depende de la causa de la infertilidad. En la [employer], la infertilidad en las mujeres se trata con medicamentos o [place].  Las mujeres pueden tomar medicamentos para:  Biomedical scientist de ovulacin.  Tratar otras enfermedades, como el [place].  Se puede realizar Clementeen Hoof para lo siguiente:  Reparar daos en los ovarios, las trompas de [place], el cuello del tero o Nurse, learning disability.  Extraer tumores del tero.  Extraer tejido cicatricial del tero, la pelvis u otros rganos femeninos. CUL ES EL TRATAMIENTO PARA LA INFERTILIDAD EN LOS [place]? El tratamiento depende de la causa de la infertilidad. En la [employer], la infertilidad en los hombres se trata con medicamentos o [place].  Los hombres pueden tomar medicamentos para:  Occupational psychologist.  Tratar otras enfermedades.  Tratar la disfuncin sexual.  Se puede realizar Clementeen Hoof para lo siguiente:  Eliminar obstrucciones en el tracto reproductivo.  Corregir  otros problemas estructurales del tracto reproductivo. QU SON LAS TCNICAS DE REPRODUCCIN ASISTIDA? Las tcnicas de reproduccin asistida (TRA) se refieren a todos los tratamientos y procedimientos que unen vulos y espermatozoides fuera del cuerpo para ayudar a una pareja a Counselling psychologist. Las TRA se suelen Oceanographer con medicamentos para la fertilidad que estimulan la ovulacin. En algunos casos, las TRA se llevan a cabo con vulos extrados del cuerpo de otra mujer (vulos de donante) o con vulos previamente fertilizados y congelados (embriones). Hay diferentes tipos de TRA. Estos incluyen los siguientes:  Inseminacin intrauterina (IIU).  En este procedimiento, los espermatozoides se colocan directamente en el tero de la mujer con un tubo largo y delgado.  Esta tcnica puede ser la ms efectiva para la infertilidad causada por problemas con los espermatozoides, incluidos el bajo recuento y la baja motilidad.  Se puede utilizar en combinacin con medicamentos para la fertilidad.  Fertilizacin in vitro (FIV).  Por lo general, se [place] esta tcnica cuando las trompas de Falopio de la mujer estn obstruidas o si el hombre tiene un bajo recuento de espermatozoides.  Los medicamentos para la infertilidad estimulan los ovarios para que produzcan varios vulos. Cuando estn maduros, estos vulos se extraen del cuerpo y se unen a los espermatozoides para ser fertilizados.  Luego los vulos fertilizados se Soil scientist  de Retail banker. Esta informacin no tiene Marine scientist el consejo del mdico. Asegrese de hacerle al mdico cualquier pregunta que tenga. Document Released: 05/06/2007 Document Revised: 05/07/2014 Document Reviewed: 12/30/2013 Elsevier Interactive Patient Education  2017 [employer].

## 2016-03-20 LAB — PAP, TP IMAGING W/ HPV RNA, RFLX HPV TYPE 16,18/45: HPV MRNA, HIGH RISK: NOT DETECTED

## 2016-06-19 ENCOUNTER — Emergency Department (HOSPITAL_COMMUNITY)
Admission: EM | Admit: 2016-06-19 | Discharge: 2016-06-19 | Disposition: A | Discharge status: Home / Self Care | Payer: Self-pay | Attending: Emergency Medicine | Admitting: Emergency Medicine

## 2016-06-19 ENCOUNTER — Encounter (HOSPITAL_COMMUNITY): Payer: Self-pay

## 2016-06-19 DIAGNOSIS — Z5321 Procedure and treatment not carried out due to patient leaving prior to being seen by health care provider: Secondary | ICD-10-CM | POA: Insufficient documentation

## 2016-06-19 DIAGNOSIS — M542 Cervicalgia: Secondary | ICD-10-CM | POA: Insufficient documentation

## 2016-06-19 NOTE — ED Notes (Signed)
Called x 3 no answer 

## 2016-06-19 NOTE — ED Triage Notes (Signed)
Patient here with increased stress and headache with neck pain x 3 days, tearful on arrival, denies trauma

## 2016-09-12 ENCOUNTER — Encounter: Payer: Self-pay | Admitting: Gynecology

## 2016-12-24 ENCOUNTER — Encounter: Payer: Self-pay | Admitting: Emergency Medicine

## 2016-12-24 ENCOUNTER — Ambulatory Visit (INDEPENDENT_AMBULATORY_CARE_PROVIDER_SITE_OTHER): Payer: Self-pay | Admitting: Emergency Medicine

## 2016-12-24 VITALS — BP 106/68 | HR 82 | Temp 98.3°F | Resp 16 | Ht 65.0 in | Wt 142.4 lb

## 2016-12-24 DIAGNOSIS — H9202 Otalgia, left ear: Secondary | ICD-10-CM

## 2016-12-24 MED ORDER — ACETIC ACID 2 % OT SOLN: 4.0000 [drp] | Freq: Three times a day (TID) | OTIC | 1 refills | Status: DC

## 2016-12-24 NOTE — Patient Instructions (Addendum)
IF you received an x-ray today, you will receive an invoice from [facility] Rad[facility]. Please contact [facility] at [phone] with questions or concerns regarding your invoice.   IF you received labwork today, you will receive an invoice from [facility]. Please contact [facility] at [phone] with questions or concerns regarding your invoice.   Our billing staff will not be able to assist you with questions regarding bills from these companies.  You will be contacted with the lab results as soon as they are available. The fastest way to get your results is to activate your My Chart account. Instructions are located on the last page of this paperwork. If you have not heard from [place] regarding the results in 2 weeks, please contact this office.     Dolor de [employer]SCO adultos ([place], Adult) Un dolor de odo puede deberse a muchas causas, que incluyen lo siguiente:  Una infeccin.  Acumulacin de cerumen.  Presin en el odo.  Algo en el odo que no debera estar ah (cuerpo extrao).  Dolor de Investment banker, operational.  Problemas dentales.  Problemas en la mandbula. El tratamiento del dolor de odo depender de la causa. Si la causa no est clara o no se Lexicographer, deber observar los sntomas hasta que el dolor de odo desaparezca o hasta que se descubra la causa. INSTRUCCIONES PARA EL CUIDADO EN EL HOGAR Est atento a cualquier cambio en los sntomas. Tome estas medidas para Theatre stage manager dolor:  Tome o aplquese los medicamentos de venta libre y recetados solamente como se lo haya indicado el mdico.  Si le recetaron un antibitico, tmelo como se lo haya indicado el mdico. No deje de usar el antibitico aunque comience a Sports administrator.  No se ponga nada en el odo que no sean los medicamentos que Special educational needs teacher.  Si se lo indican, aplique calor en la zona afectada tan frecuentemente como se lo haya indicado el mdico. Use la fuente de calor que el mdico le  recomiende, como una compresa de calor hmedo o una almohadilla trmica. ? Coloque una [employer] piel y la fuente de Freight forwarder. ? Aplique el calor durante 20 a 17minutos. ? Retire la fuente de calor si la piel se le pone de color rojo brillante. Esto es muy importante si no puede sentir el dolor, el calor ni el fro. Puede correr un riesgo mayor de sufrir quemaduras.  Si se lo indican, aplique hielo en el odo: ? Ponga el hielo en una bolsa plstica. ? Coloque una [employer] piel y la bolsa de hielo. ? Coloque el hielo durante 28minutos, 2 a 3veces por da.  Intente descansar en posicin erguida, en lugar de recostarse. Esto puede ayudar a reducir la presin en el odo y Best boy.  Mastique goma de mascar si esto ayuda a Best boy de odos.  Trate cualquier [employer] se lo haya indicado el mdico.  Concurra a todas las visitas de control como se lo haya indicado el mdico. Esto es importante. SOLICITE ATENCIN MDICA SI:  El dolor no mejora en el trmino de 2das.  El dolor de odo [place].  Aparecen nuevos sntomas.  Tiene fiebre. SOLICITE ATENCIN MDICA DE INMEDIATO SI:  Tiene un dolor de cabeza intenso.  Presenta rigidez en el cuello.  Presenta dificultad para tragar.  Hay enrojecimiento o hinchazn detrs de la oreja.  Le sale sangre o lquido del odo.  Tiene prdida de la audicin.  Siente mareos.  Esta informacin no tiene Marine scientist el consejo del mdico. Asegrese de hacerle al mdico cualquier pregunta que tenga. Document Released: 07/24/2007 Document Revised: 05/07/2014 Document Reviewed: 10/10/2015 Elsevier Interactive Patient Education  [employer].

## 2016-12-24 NOTE — Progress Notes (Signed)
Debbie Campos 36 y.o.   Chief Complaint  Patient presents with  . Ear Problem    LEFT - no pain, but feels like a cracking    HISTORY OF PRESENT ILLNESS: This is a 36 y.o. female complaining of left ear discomfort following recent URI.  HPI   Prior to Admission medications   Medication Sig Start Date End Date Taking? Authorizing Provider  acetic acid (VOSOL) 2 % otic solution Place 4 drops into the left ear 3 (three) times daily. 12/24/16   Horald Pollen, MD  ibuprofen (ADVIL,MOTRIN) 600 MG tablet Take 1 tablet (600 mg total) by mouth every 6 (six) hours as needed. Patient not taking: Reported on 03/15/2016 12/28/14   Tereasa Coop, PA-C  letrozole [facility]) 2.5 MG tablet Take one tablet day 3-7 of cycle Patient not taking: Reported on 12/24/2016 03/15/16   Terrance Mass, MD    No Known Allergies  Patient Active Problem List   Diagnosis Date Noted  . Female infertility, primary 03/15/2016  . Umbilical pain 31/54/0086  . Jaw pain 10/19/2013  . Injury of right index finger 10/19/2013  . Female infertility 06/27/2012  . Situational anxiety 02/27/2012  . Vaginal discharge 11/26/2011  . Nonallopathic lesion 11/03/2011  . Sacro-iliac pain 11/03/2011  . Menstrual cramps 09/12/2011    Past Medical History:  Diagnosis Date  . Bell's palsy 2011   Told it was due to stress, spontaneously resolved  . Environmental allergies   . Infertility, female     Past Surgical History:  Procedure Laterality Date  . none      Social History   Social History  . Marital status: Married    Spouse name: N/A  . Number of children: N/A  . Years of education: N/A   Occupational History  . Not on file.   Social History Main Topics  . Smoking status: Never Smoker  . Smokeless tobacco: Never Used  . Alcohol use No  . Drug use: No  . Sexual activity: Yes    Birth control/ protection: None     Comment: trying to get pregnant   Other Topics Concern  . Not on  file   Social History Narrative   Works part time   [place] about [place] from a friend   Primary language is spanish     Family History  Problem Relation Age of Onset  . Diabetes Paternal Grandmother      Review of Systems  Constitutional: Negative.  Negative for chills and fever.  HENT: Positive for congestion and ear pain. Negative for ear discharge, hearing loss, nosebleeds, sinus pain, sore throat and tinnitus.   Eyes: Negative for discharge and redness.  Respiratory: Negative for cough and shortness of breath.   Cardiovascular: Negative for chest pain and palpitations.  Gastrointestinal: Negative for abdominal pain, nausea and vomiting.  Genitourinary: Negative for dysuria and hematuria.  Skin: Negative for rash.  Neurological: Negative.  Negative for dizziness and headaches.  Endo/Heme/Allergies: Negative.   All other systems reviewed and are negative.   Vitals:   12/24/16 1525  BP: 106/68  Pulse: 82  Resp: 16  Temp: 98.3 F (36.8 C)  SpO2: 98%    Physical Exam  Constitutional: She is oriented to person, place, and time. She appears well-developed and well-nourished.  HENT:  Head: Normocephalic and atraumatic.  Right Ear: Tympanic membrane, external ear and ear canal normal.  Left Ear: Tympanic membrane, external ear and ear canal normal.  Nose: Nose normal.  Mouth/Throat:  Oropharynx is clear and moist.  Eyes: Pupils are equal, round, and reactive to light. Conjunctivae and EOM are normal.  Neck: Normal range of motion. Neck supple. No thyromegaly present.  Cardiovascular: Normal rate and regular rhythm.   Pulmonary/Chest: Effort normal.  Musculoskeletal: Normal range of motion.  Lymphadenopathy:    She has no cervical adenopathy.  Neurological: She is alert and oriented to person, place, and time. No sensory deficit. She exhibits normal muscle tone.  Skin: Skin is warm and dry. Capillary refill takes less than 2 seconds. No rash noted.  Psychiatric: She has a  normal mood and affect.  Vitals reviewed.    ASSESSMENT & PLAN: Zan was seen today for ear problem.  Diagnoses and all orders for this visit:  Ear discomfort, left  Other orders -     acetic acid (VOSOL) 2 % otic solution; Place 4 drops into the left ear 3 (three) times daily.    Patient Instructions       IF you received an x-ray today, you will receive an invoice from [facility]. Please contact [facility] at [phone] with questions or concerns regarding your invoice.   IF you received labwork today, you will receive an invoice from [place]. Please contact [facility] at [phone] with questions or concerns regarding your invoice.   Our billing staff will not be able to assist you with questions regarding bills from these companies.  You will be contacted with the lab results as soon as they are available. The fastest way to get your results is to activate your My Chart account. Instructions are located on the last page of this paperwork. If you have not heard from [place] regarding the results in 2 weeks, please contact this office.     Dolor de SYSCO adultos ([place], Adult) Un dolor de odo puede deberse a muchas causas, que incluyen lo siguiente:  Una infeccin.  Acumulacin de cerumen.  Presin en el odo.  Algo en el odo que no debera estar ah (cuerpo extrao).  Dolor de Investment banker, operational.  Problemas dentales.  Problemas en la mandbula. El tratamiento del dolor de odo depender de la causa. Si la causa no est clara o no se Lexicographer, deber observar los sntomas hasta que el dolor de odo desaparezca o hasta que se descubra la causa. INSTRUCCIONES PARA EL CUIDADO EN EL HOGAR Est atento a cualquier cambio en los sntomas. Tome estas medidas para Theatre stage manager dolor:  Tome o aplquese los medicamentos de venta libre y recetados solamente como se lo haya indicado el mdico.  Si le recetaron un antibitico, tmelo como se lo  haya indicado el mdico. No deje de usar el antibitico aunque comience a Sports administrator.  No se ponga nada en el odo que no sean los medicamentos que Special educational needs teacher.  Si se lo indican, aplique calor en la zona afectada tan frecuentemente como se lo haya indicado el mdico. Use la fuente de calor que el mdico le recomiende, como una compresa de calor hmedo o una almohadilla trmica. ? Coloque una [employer] piel y la fuente de Freight forwarder. ? Aplique el calor durante 20 a 75minutos. ? Retire la fuente de calor si la piel se le pone de color rojo brillante. Esto es muy importante si no puede sentir el dolor, el calor ni el fro. Puede correr un riesgo mayor de sufrir quemaduras.  Si se lo indican, aplique hielo en el odo: ? Ponga el hielo en Nicoletta Ba  plstica. ? Coloque una [employer] piel y la bolsa de hielo. ? Coloque el hielo durante 1minutos, 2 a 3veces por da.  Intente descansar en posicin erguida, en lugar de recostarse. Esto puede ayudar a reducir la presin en el odo y Best boy.  Mastique goma de mascar si esto ayuda a Best boy de odos.  Trate cualquier [employer] se lo haya indicado el mdico.  Concurra a todas las visitas de control como se lo haya indicado el mdico. Esto es importante. SOLICITE ATENCIN MDICA SI:  El dolor no mejora en el trmino de 2das.  El dolor de odo [place].  Aparecen nuevos sntomas.  Tiene fiebre. SOLICITE ATENCIN MDICA DE INMEDIATO SI:  Tiene un dolor de cabeza intenso.  Presenta rigidez en el cuello.  Presenta dificultad para tragar.  Hay enrojecimiento o hinchazn detrs de la oreja.  Le sale sangre o lquido del odo.  Tiene prdida de la audicin.  Siente mareos. Esta informacin no tiene Marine scientist el consejo del mdico. Asegrese de hacerle al mdico cualquier pregunta que tenga. Document Released: 07/24/2007 Document Revised: 05/07/2014 Document Reviewed:  10/10/2015 Elsevier Interactive Patient Education  2018 [employer].      Agustina Caroli, MD [facility]

## 2017-03-07 ENCOUNTER — Encounter: Payer: Self-pay | Admitting: Obstetrics & Gynecology

## 2017-03-07 ENCOUNTER — Ambulatory Visit (INDEPENDENT_AMBULATORY_CARE_PROVIDER_SITE_OTHER): Payer: Self-pay | Admitting: Obstetrics & Gynecology

## 2017-03-07 VITALS — BP 130/80 | Ht 65.25 in | Wt 134.0 lb

## 2017-03-07 DIAGNOSIS — N979 Female infertility, unspecified: Secondary | ICD-10-CM

## 2017-03-07 DIAGNOSIS — Z01419 Encounter for gynecological examination (general) (routine) without abnormal findings: Secondary | ICD-10-CM

## 2017-03-07 NOTE — Patient Instructions (Signed)
1. Well female exam with routine gynecological exam Normal gynecologic exam.  Pap test negative with negative HPV October 2017.  Will repeat Pap next year.  Breasts normal.  Fasting labs here today. - CBC - Comp Met (CMET) - TSH - Lipid panel - Vitamin D 1,25 dihydroxy  2. Primary female infertility Husband healing from coronary bypass surgery.  Currently abstinent.  Desires conception, but postponing fertility treatment.  Lorna Few, fue un placer conocerla hoy!  Voy a informarla de Financial trader.  [place] (Health Maintenance, Female) Un estilo de vida saludable y los cuidados preventivos pueden favorecer considerablemente a la salud y Musician. Pregunte a su mdico cul es el cronograma de exmenes peridicos apropiado para usted. Esta es una buena oportunidad para consultarlo sobre cmo prevenir enfermedades y [place] sano. Adems de los controles, hay muchas otras cosas que puede hacer usted mismo. Los expertos han realizado numerosas investigaciones [employer]itor cambios en el estilo de vida y las medidas de prevencin que, [place], lo ayudarn a mantenerse sano. Solicite a su mdico ms informacin. EL PESO Y LA DIETA Consuma una dieta saludable.  Asegrese de [employer] verduras, frutas, productos lcteos de bajo contenido de [place] y Advertising account planner.  No consuma muchos alimentos de alto contenido de grasas slidas, azcares agregados o sal.  Realice actividad fsica con regularidad. Esta es una de las prcticas ms importantes que puede hacer por su salud. ? La Delorise Shiner de los adultos deben hacer ejercicio durante al menos 192mnutos por semana. El ejercicio debe aumentar la frecuencia cardaca y pActorla transpiracin (ejercicio de iBaskin. ? La mayora de los adultos tambin deben hacer ejercicios de elongacin al mToysRusveces a la semana. Agregue esto al su plan de ejercicio de intensidad moderada. Mantenga un  peso saludable.  El ndice de masa corporal ([facility] es una medida que puede utilizarse para identificar posibles problemas de pHamilton Proporciona una estimacin de la grasa corporal basndose en el peso y la altura. Su mdico puede ayudarle a dRadiation protection practitionerIForest Oaksy a lScientist, forensico mTheatre managerun peso saludable.  Para las mujeres de 20aos o ms: ? Un ISchulze Surgery Center Incmenor de 18,5 se considera bajo peso. ? Un IThe Surgery Center Of Newport Coast LLCentre 18,5 y 24,9 es normal. ? Un IMission Hospital And Asheville Surgery Centerentre 25 y 29,9 se considera sobrepeso. ? Un IMC de 30 o ms se considera obesidad. Observe los niveles de colesterol y lpidos en la sangre.  Debe comenzar a rEnglish as a second language teacherde lpidos y cResearch officer, trade unionen la sangre a los 20aos y luego repetirlos cada 550aos  Es posible que nAutomotive engineerlos niveles de colesterol con mayor frecuencia si: ? Sus niveles de lpidos y colesterol son altos. ? Es mayor de [id number] ? Presenta un alto riesgo de padecer enfermedades cardacas. DETECCIN DE CNCER Cncer de pulmn  Se recomienda realizar exmenes de deteccin de cncer de pulmn a personas adultas entre 536y 847aos que estn en riesgo de dHorticulturist, commercialde pulmn por sus antecedentes de consumo de tabaco.  Se recomienda una tomografa computarizada de baja dosis de los pulmones todos los aos a las personas que: ? Fuman actualmente. ? Hayan dejado el hbito en algn momento en los ltimos 15aos. ? Hayan fumado durante 30aos un paquete diario. Un paquete-ao equivale a fumar un promedio de un paquete de cigarrillos diario durante un ao.  Los exmenes de deteccin anuales deben continuar hasta que hayan pasado 15aos desde que dej de fumar.  Ya no debern realizarse si  tiene un problema de salud que le impida recibir tratamiento para Science writer de pulmn. Cncer de mama  Practique la autoconciencia de la mama. Esto significa reconocer la apariencia normal de sus mamas y cmo las siente.  Tambin significa realizar autoexmenes regulares de [employer].  Informe a su mdico sobre cualquier cambio, sin importar cun pequeo sea.  Si tiene entre 20 y 10 aos, un mdico debe realizarle un examen clnico de las mamas como parte del examen regular de [place], cada 1 a 3aos.  Si tiene 40aos o ms, debe Information systems manager clnico de las Microsoft. Tambin considere realizarse una [place]) todos los [place].  Si tiene antecedentes familiares de cncer de mama, hable con su mdico para someterse a un estudio gentico.  Si tiene alto riesgo de Chief Financial Officer de mama, hable con su mdico para someterse a Public house manager y [employer].  La evaluacin del gen del cncer de mama (BRCA) se recomienda a mujeres que tengan familiares con cnceres relacionados con el BRCA. Los cnceres relacionados con el BRCA incluyen los siguientes: ? [place]. ? Ovario. ? Trompas. ? Cnceres de peritoneo.  Los resultados de la evaluacin determinarn la necesidad de asesoramiento gentico y de [place] de BRCA1 y BRCA2. Cncer de cuello del tero El mdico puede recomendarle que se haga pruebas peridicas de deteccin de cncer de los rganos de la pelvis (ovarios, tero y vagina). Estas pruebas incluyen un examen plvico, que abarca controlar si se produjeron cambios microscpicos en la superficie del cuello del tero (prueba de Papanicolaou). Pueden recomendarle que se haga estas pruebas cada 3aos, a partir de los 21aos.  A las mujeres que tienen entre 30 y 31aos, los mdicos pueden recomendarles que se sometan a exmenes plvicos y pruebas de Papanicolaou cada 75aos, o a la prueba de Papanicolaou y el examen plvico en combinacin con estudios de deteccin del virus del papiloma humano (VPH) cada 5aos. Algunos tipos de VPH aumentan el riesgo de Chief Financial Officer de cuello del tero. La prueba para la deteccin del VPH tambin puede realizarse a mujeres de cualquier edad cuyos resultados de la prueba de  Papanicolaou no sean claros.  Es posible que otros mdicos no recomienden exmenes de deteccin a mujeres no embarazadas que se consideran sujetos de bajo riesgo de Chief Financial Officer de pelvis y que no tienen sntomas. Pregntele al mdico si un examen plvico de deteccin es adecuado para usted.  Si ha recibido un tratamiento para Science writer cervical o una enfermedad que podra causar cncer, necesitar realizarse una prueba de Papanicolaou y controles durante al menos 62 aos de concluido el [place]. Si no se ha hecho el Papanicolaou con regularidad, debern volver a evaluarse los factores de riesgo (como tener un nuevo compaero sexual), para Teacher, adult education si debe realizarse los estudios nuevamente. Algunas mujeres sufren problemas mdicos que aumentan la probabilidad de Museum/gallery curator cncer de cuello del tero. En estos casos, el mdico podr [employer] se realicen controles y pruebas de Papanicolaou con ms frecuencia. Cncer colorrectal  Este tipo de cncer puede detectarse y a menudo prevenirse.  Por lo general, los estudios de rutina se deben Medical laboratory scientific officer a Field seismologist a Proofreader de los 71 aos y [place] 67 aos.  Sin embargo, el mdico podr aconsejarle que lo haga antes, si tiene factores de riesgo para el cncer de colon.  Tambin puede recomendarle que use un kit de prueba para hallar sangre oculta en la materia fecal.  Es posible que se use una pequea cmara en el extremo de un tubo para examinar directamente el colon (sigmoidoscopia o colonoscopia) a fin de Hydrographic surveyor formas tempranas de cncer colorrectal.  Los exmenes de rutina generalmente comienzan a los 28aos.  El examen directo del colon se debe repetir cada 5 a 10aos hasta los 75aos. Sin embargo, es posible que se realicen exmenes con mayor frecuencia, si se detectan formas tempranas de plipos precancerosos o pequeos bultos. Cncer de piel  Revise la piel de la cabeza a los pies con regularidad.  Informe a su mdico si aparecen  nuevos lunares o los que tiene se modifican, especialmente en su forma y color.  Tambin notifique al mdico si tiene un lunar que es ms grande que el tamao de una goma de lpiz.  Siempre use pantalla solar. Aplique pantalla solar de Kerry Dory y repetida a lo largo del Training and development officer.  Protjase usando mangas y [employer], un sombrero de ala ancha y gafas para el sol, siempre que se encuentre en el exterior. ENFERMEDADES CARDACAS, DIABETES E HIPERTENSIN ARTERIAL  La hipertensin arterial causa enfermedades cardacas y [place] el riesgo de ictus. La hipertensin arterial es ms probable en los siguientes casos: ? Las personas que tienen la presin arterial en el extremo del rango normal (100-139/85-89 mm Hg). ? [employer] Corporation con sobrepeso u obesidad. ? Scientist, water quality.  Si usted tiene entre 18 y 39 aos, debe medirse la presin arterial cada 3 a 5 aos. Si usted tiene 40 aos o ms, debe medirse la presin arterial [employer]. Debe medirse la presin arterial dos veces: una vez cuando est en un hospital o una clnica y la otra vez cuando est en otro sitio. Registre el promedio de [employer]. Para controlar su presin arterial cuando no est en un hospital o [place], puede usar lo siguiente: ? Jorje Guild automtica para medir la presin arterial en una farmacia. ? Un monitor para medir la presin arterial en el hogar.  Si tiene entre 65 y 2 aos, consulte a su mdico si debe tomar aspirina para prevenir el ictus.  Realcese exmenes de deteccin de la diabetes con regularidad. Esto incluye la toma de [place] de sangre para controlar el nivel de azcar en la sangre durante el [place]. ? Si tiene un peso normal y un bajo riesgo de padecer diabetes, realcese este anlisis cada tres aos despus de los 45aos. ? Si tiene sobrepeso y un alto riesgo de padecer diabetes, considere someterse a este anlisis antes o con mayor frecuencia. PREVENCIN DE  INFECCIONES HepatitisB  Si tiene un riesgo ms alto de Museum/gallery curator hepatitis B, debe someterse a un examen de deteccin de este virus. Se considera que tiene un alto riesgo de contraer hepatitis B si: ? Naci en un pas donde la hepatitis B es frecuente. Pregntele a su mdico qu pases son considerados de Public affairs consultant. ? Sus padres nacieron en un pas de alto riesgo y usted no recibi una vacuna que lo proteja contra la hepatitis B (vacuna contra la hepatitis B). ? [place]. ? [place] agujas para inyectarse drogas. ? Vive con alguien que tiene hepatitis B. ? Ha tenido sexo con alguien que tiene hepatitis B. ? Recibe tratamiento de hemodilisis. ? Toma ciertos medicamentos para el cncer, trasplante de rganos y afecciones autoinmunitarias. Hepatitis C  Se recomienda un anlisis de [place] para: ? [employer] 1945 y 1965. ? Todas las personas que tengan un  riesgo de haber contrado hepatitis C. Enfermedades de transmisin sexual (ETS).  Debe realizarse pruebas de deteccin de enfermedades de transmisin sexual (ETS), incluidas gonorrea y clamidia si: ? Es sexualmente activo y es menor de 77AJO. ? Es mayor de 24aos, y Investment banker, operational informa que corre riesgo de tener este tipo de infecciones. ? La actividad sexual ha cambiado desde que le hicieron la ltima prueba de deteccin y tiene un riesgo mayor de Best boy clamidia o Radio broadcast assistant. Pregntele al mdico si usted tiene riesgo.  Si no tiene el VIH, pero corre riesgo de infectarse por el virus, se recomienda tomar diariamente un medicamento recetado para evitar la infeccin. Esto se conoce como profilaxis previa a la exposicin. Se considera que est en riesgo si: ? Es [place] sexualmente y no [place] preservativos habitualmente o no conoce el estado del VIH de sus Advertising copywriter. ? Se inyecta drogas. ? Es [place] sexualmente con Ardelia Mems pareja que tiene VIH. Consulte a su mdico para saber si tiene un alto riesgo de infectarse por el  VIH. Si opta por comenzar la profilaxis previa a la exposicin, primero debe realizarse anlisis de deteccin del VIH. Luego, le harn anlisis cada 64mses mientras est tomando los medicamentos para la profilaxis previa a la exposicin. ESt. [facility] Medical [facility] Si es premenopusica y puede quedar eThe [place] solicite a su mdico asesoramiento previo a la concepcin.  Si puede quedar embarazada, tome 400 a [id number](mcg) de cido fAnheuser-Busch  Si desea evitar el embarazo, hable con su mdico sobre el control de la natalidad (anticoncepcin). OSTEOPOROSIS Y MENOPAUSIA  La osteoporosis es una enfermedad en la que los huesos pierden los minerales y la fuerza por el avance de la edad. El resultado pueden ser fracturas graves en los h[place] El riesgo de osteoporosis puede identificarse con uArdelia Memsprueba de densidad sea.  Si tiene 65aos o ms, o si est en riesgo de sufrir osteoporosis y fracturas, pregunte a su mdico si debe someterse a exmenes.  Consulte a su mdico si debe tomar un suplemento de calcio o de vitamina D para reducir el riesgo de osteoporosis.  La menopausia puede presentar ciertos sntomas fsicos y rGaffer  La terapia de reemplazo hormonal puede reducir algunos de estos sntomas y rGaffer Consulte a su mdico para saber si la terapia de reemplazo hormonal es conveniente para usted. INSTRUCCIONES PARA EL CUIDADO EN EL HOGAR  Realcese los estudios de rutina de la salud, dentales y de lPublic librarian  MW[place]  No consuma ningn producto que contenga tabaco, lo que incluye cigarrillos, tabaco de mHigher education careers advisero cPsychologist, sport and exercise  Si est embarazada, no beba alcohol.  Si est amamantando, reduzca el consumo de alcohol y la frecuencia con la que consume.  Si es mujer y no est embarazada limite el consumo de alcohol a no ms de 1 medida por da. Una medida equivale a 12onzas de cerveza, 5onzas de vino o 1onzas de bebidas alcohlicas de alta  graduacin.  No consuma drogas.  No comparta agujas.  Solicite ayuda a su mdico si necesita apoyo o informacin para abandonar las drogas.  Informe a su mdico si a menudo se siente deprimido.  Notifique a su mdico si alguna vez ha sido vctima de abuso o si no se siente seguro en su hogar. Esta informacin no tiene cMarine scientistel consejo del mdico. Asegrese de hacerle al mdico cualquier pregunta que tenga. Document Released: 04/05/2011 Document Revised: 05/07/2014 Document Reviewed: 01/18/2015 Elsevier Interactive Patient Education  2018 [place].

## 2017-03-07 NOTE — Progress Notes (Signed)
Debbie Campos 1981-02-10 [id number]   History:    36 y.o. G0  Married.  Husband just had Coronary By-pass Surgery.  RP:  Established patient presenting for annual gyn exam   HPI:  H/O Primary Infertility.  At this time will not attempt conception as her husband is healing from coronary bypass surgery.  Menstrual periods every month, normal flow.  No pelvic pain.  Normal vaginal secretions.  Breasts normal.  Urine and bowel movements normal.  Will do fasting labs here today.  Past medical history,surgical history, family history and social history were all reviewed and documented in the EPIC chart.  Gynecologic History Patient's last menstrual period was 02/15/2017. Contraception: Currently abstinent/Primary Infertility Last Pap: 01/2016. Results were: Negative/HPV HR neg Last mammogram: Never  Obstetric History OB History  Gravida Para Term Preterm AB Living  0            SAB TAB Ectopic Multiple Live Births                    ROS: A ROS was performed and pertinent positives and negatives are included in the history.  GENERAL: No fevers or chills. HEENT: No change in vision, no earache, sore throat or sinus congestion. NECK: No pain or stiffness. CARDIOVASCULAR: No chest pain or pressure. No palpitations. PULMONARY: No shortness of breath, cough or wheeze. GASTROINTESTINAL: No abdominal pain, nausea, vomiting or diarrhea, melena or bright red blood per rectum. GENITOURINARY: No urinary frequency, urgency, hesitancy or dysuria. MUSCULOSKELETAL: No joint or muscle pain, no back pain, no recent trauma. DERMATOLOGIC: No rash, no itching, no lesions. ENDOCRINE: No polyuria, polydipsia, no heat or cold intolerance. No recent change in weight. HEMATOLOGICAL: No anemia or easy bruising or bleeding. NEUROLOGIC: No headache, seizures, numbness, tingling or weakness. PSYCHIATRIC: No depression, no loss of interest in normal activity or change in sleep pattern.     Exam:   BP  130/80   Ht 5' 5.25" (1.657 m)   Wt 134 lb (60.8 kg)   LMP 02/15/2017   BMI 22.13 kg/m   Body mass index is 22.13 kg/m.  General appearance : Well developed well nourished female. No acute distress HEENT: Eyes: no retinal hemorrhage or exudates,  Neck supple, trachea midline, no carotid bruits, no thyroidmegaly Lungs: Clear to auscultation, no rhonchi or wheezes, or rib retractions  Heart: Regular rate and rhythm, no murmurs or gallops Breast:Examined in sitting and supine position were symmetrical in appearance, no palpable masses or tenderness,  no skin retraction, no nipple inversion, no nipple discharge, no skin discoloration, no axillary or supraclavicular lymphadenopathy Abdomen: no palpable masses or tenderness, no rebound or guarding Extremities: no edema or skin discoloration or tenderness  Pelvic: Vulva normal  Bartholin, Urethra, Skene Glands: Within normal limits             Vagina: No gross lesions or discharge  Cervix: No gross lesions or discharge  Uterus  AV, normal size, shape and consistency, non-tender and mobile  Adnexa  Without masses or tenderness  Anus and perineum  normal   Assessment/Plan:  36 y.o. female for annual exam   1. Well female exam with routine gynecological exam Normal gynecologic exam.  Pap test negative with negative HPV October 2017.  Will repeat Pap next year.  Breasts normal.  Fasting labs here today. - CBC - Comp Met (CMET) - TSH - Lipid panel - Vitamin D 1,25 dihydroxy  2. Primary female infertility Husband healing from coronary bypass surgery.  Currently abstinent.  Desires conception, but postponing fertility treatment.  Princess Bruins MD, 10:40 AM 03/07/2017

## 2017-03-12 ENCOUNTER — Encounter: Payer: Self-pay | Admitting: Urgent Care

## 2017-03-12 ENCOUNTER — Ambulatory Visit (INDEPENDENT_AMBULATORY_CARE_PROVIDER_SITE_OTHER): Payer: Self-pay | Admitting: Urgent Care

## 2017-03-12 VITALS — BP 116/72 | HR 87 | Temp 98.4°F | Resp 16 | Ht 65.25 in | Wt 134.8 lb

## 2017-03-12 DIAGNOSIS — E782 Mixed hyperlipidemia: Secondary | ICD-10-CM

## 2017-03-12 LAB — COMPREHENSIVE METABOLIC PANEL
AG Ratio: 1.8 (calc) (ref 1.0–2.5)
ALT: 18 U/L (ref 6–29)
AST: 14 U/L (ref 10–30)
Albumin: 4.6 g/dL (ref 3.6–5.1)
Alkaline phosphatase (APISO): 67 U/L (ref 33–115)
BUN: 12 mg/dL (ref 7–25)
CHLORIDE: 104 mmol/L (ref 98–110)
CO2: 26 mmol/L (ref 20–32)
CREATININE: 0.58 mg/dL (ref 0.50–1.10)
Calcium: 9.2 mg/dL (ref 8.6–10.2)
GLOBULIN: 2.6 g/dL (ref 1.9–3.7)
GLUCOSE: 90 mg/dL (ref 65–99)
Potassium: 4.2 mmol/L (ref 3.5–5.3)
Sodium: 138 mmol/L (ref 135–146)
Total Bilirubin: 0.4 mg/dL (ref 0.2–1.2)
Total Protein: 7.2 g/dL (ref 6.1–8.1)

## 2017-03-12 LAB — CBC
HCT: 39.6 % (ref 35.0–45.0)
Hemoglobin: 13.3 g/dL (ref 11.7–15.5)
MCH: 28.6 pg (ref 27.0–33.0)
MCHC: 33.6 g/dL (ref 32.0–36.0)
MCV: 85.2 fL (ref 80.0–100.0)
MPV: 9.7 fL (ref 7.5–12.5)
PLATELETS: 228 10*3/uL (ref 140–400)
RBC: 4.65 10*6/uL (ref 3.80–5.10)
RDW: 13.5 % (ref 11.0–15.0)
WBC: 5.4 10*3/uL (ref 3.8–10.8)

## 2017-03-12 LAB — LIPID PANEL
CHOL/HDL RATIO: 4.1 (calc) (ref <5.0)
CHOLESTEROL: 155 mg/dL (ref <200)
HDL: 38 mg/dL — AB (ref >50)
LDL CHOLESTEROL (CALC): 92 mg/dL
Non-HDL Cholesterol (Calc): 117 mg/dL (calc) (ref <130)
Triglycerides: 153 mg/dL — ABNORMAL HIGH (ref <150)

## 2017-03-12 LAB — VITAMIN D 1,25 DIHYDROXY
Vitamin D 1, 25 (OH)2 Total: 67 pg/mL (ref 18–72)
Vitamin D3 1, 25 (OH)2: 67 pg/mL

## 2017-03-12 LAB — TSH: TSH: 1.24 mIU/L

## 2017-03-12 NOTE — Progress Notes (Signed)
    MRN: [id number] DOB: 17-Sep-1980  Subjective:   Debbie Campos is a 36 y.o. female presenting for follow up on cholesterol. She had her OB/GYN visit ~1 week ago and was advised to seek treatment with her PCP for her cholesterol. Patient eats very healthily, has an active lifestyle. However, she became very worried that they told her this given that her husband just had CABG.    Debbie Campos is not currently taking any medications and has No Known Allergies.  Debbie Campos  has a past medical history of Bell's palsy (2011), Environmental allergies, and Infertility, female. Also  has a past surgical history that includes none.  Objective:   Vitals: BP 116/72   Pulse 87   Temp 98.4 F (36.9 C) (Oral)   Resp 16   Ht 5' 5.25" (1.657 m)   Wt 134 lb 12.8 oz (61.1 kg)   LMP 02/15/2017   SpO2 99%   BMI 22.26 kg/m   Physical Exam  Constitutional: She is oriented to person, place, and time. She appears well-developed and well-nourished.  Cardiovascular: Normal rate.  Pulmonary/Chest: Effort normal.  Neurological: She is alert and oriented to person, place, and time.   Assessment and Plan :   1. Mixed hyperlipidemia - Reviewed all labs. Patient is very healthy. She is doing very well, recommended continued healthy lifestyle, recheck labs in 1 year.  Jaynee Eagles, PA-C [facility] [phone] 03/12/2017 8:44 AM

## 2017-03-12 NOTE — Patient Instructions (Addendum)
Colesterol elevado (High Cholesterol) El trmino colesterol elevado hace referencia a una concentracin alta de colesterol en la sangre. El colesterol es una protena blanca y cerosa parecida a la grasa que el organismo necesita en pequeas cantidades. El hgado fabrica todo el colesterol que necesita. El exceso de colesterol proviene de los alimentos que come. El colesterol viaja por el torrente sanguneo hasta los vasos sanguneos. Si tiene el colesterol elevado, este puede depositarse (placa) en las paredes de los vasos sanguneos, lo que ocasiona el estrechamiento y la rigidez de las arterias. La placa aumenta el riesgo de ataque cardaco y de ictus. Trabaje con el mdico para [employer] concentraciones de colesterol en un rango saludable. FACTORES DE RIESGO Existen varios factores que pueden aumentar la probabilidad de que tenga colesterol elevado. Estos incluyen:  Consumir alimentos con alto contenido de grasa animal (grasa saturada) o colesterol.  Tener sobrepeso.  No hacer suficiente ejercicio fsico.  Tener antecedentes familiares de colesterol elevado. SIGNOS Y SNTOMAS El colesterol elevado no causa sntomas. DIAGNSTICO El mdico puede realizar un anlisis de sangre para controlar si tiene el colesterol elevado. Si es mayor de 20aos, el mdico puede controlarle el colesterol cada 4 a 6aos. Los controles pueden ser ms frecuentes si ya tuvo el colesterol elevado u otros factores de riesgo de enfermedades cardacas. El anlisis de sangre de colesterol mide lo siguiente:  El colesterol malo (colesterol LDL), el tipo que causa enfermedades cardacas. Este valor debe estar por debajo de 100.  El colesterol bueno (colesterol HDL), el tipo que ayuda a brindar proteccin contra las enfermedades cardacas. El nivel saludable de colesterol HDL es 60 o ms.  Colesterol total Es el valor combinado del colesterol LDL y el HDL. El valor saludable es de menos de200. TRATAMIENTO El  colesterol elevado puede tratarse con cambios en la dieta y el estilo de vida, y con medicamentos.  Los Levi Strauss en la dieta pueden incluir la ingesta de una mayor cantidad de cereales integrales, frutas, verduras, frutos secos y pescado. Adems, tal vez deba dejar de comer carne roja y alimentos con gran cantidad de azcares agregados.  Los Levi Strauss en el estilo de vida pueden incluir sesiones de ejercicios aerbicos durante por lo menos 63minutos, tres veces por semana, entre ellos, caminar, andar en bicicleta y nadar. Los ejercicios aerbicos junto con una dieta sana pueden ayudar a que se Quarry manager en un peso saludable. Los Levi Strauss en el estilo de vida tambin pueden incluir dejar de fumar.  Si los Harley-Davidson dieta y [place] de vida no son suficientes para Advertising copywriter, el mdico puede recetarle una estatina. Se ha demostrado que Coca-Cola reduce el colesterol y, [place], el riesgo de enfermedades cardacas. INSTRUCCIONES PARA EL CUIDADO EN EL HOGAR  Tome los medicamentos de venta libre o recetados solamente segn las indicaciones del mdico.  Illene Bolus una dieta sana tal como el mdico le indic. Por ejemplo: ? Coma pollo (sin piel), pescado, ternera, mariscos, pechuga de [place] y cortes de carne roja de pulpa o de lomo. ? No coma comidas fritas y carnes grasas, como salchichas y salame. ? Coma muchas frutas, como manzanas. ? Coma gran cantidad de verduras, como brcoli, papas y zanahorias. ? Coma porotos, guisantes secos y lentejas. ? Coma cereales, como cebada, arroz, cuscus y trigo burgol. ? Coma pastas sin salsas con crema. ? Tome leche semidescremada y sin grasa, y coma yogur y quesos bajos en grasas o sin grasa. No coma ni beba Mattel, crema,  helado, yemas de huevo y quesos duros. ? No coma margarinas en barra ni untables que contengan grasas trans (que tambin se conocen como aceites parcialmente hidrogenados). ? No coma tortas, galletas, galletitas ni otros  productos horneados que contengan grasas trans. ? No coma aceites tropicales saturados, como el de coco y el de [place].  Haga ejercicio segn las indicaciones del mdico. Aumente el [place] de [place], por [place], haga jardinera o salga a caminar.  Cumpla con todas las visitas de control.  SOLICITE ATENCIN MDICA SI:  Tiene dificultad para seguir Gaffer o mantener un peso saludable.  Necesita ayuda para comenzar un programa de ejercicios.  Necesita ayuda para dejar de fumar.  SOLICITE ATENCIN MDICA DE INMEDIATO SI:  Siente dolor en el pecho.  Tiene dificultad para respirar.  Esta informacin no tiene Marine scientist el consejo del mdico. Asegrese de hacerle al mdico cualquier pregunta que tenga. Document Released: 04/16/2005 Document Revised: 05/07/2014 Document Reviewed: 10/15/2015 Elsevier Interactive Patient Education  2017 Reynolds [employer].    IF you received an x-ray today, you will receive an invoice from [facility]. Please contact [facility] at [phone] with questions or concerns regarding your invoice.   IF you received labwork today, you will receive an invoice from [place]. Please contact [facility] at [phone] with questions or concerns regarding your invoice.   Our billing staff will not be able to assist you with questions regarding bills from these companies.  You will be contacted with the lab results as soon as they are available. The fastest way to get your results is to activate your My Chart account. Instructions are located on the last page of this paperwork. If you have not heard from [place] regarding the results in 2 weeks, please contact this office.

## 2018-05-13 ENCOUNTER — Other Ambulatory Visit: Payer: Self-pay

## 2018-05-13 ENCOUNTER — Encounter: Payer: Self-pay | Admitting: Family Medicine

## 2018-05-13 ENCOUNTER — Ambulatory Visit: Payer: Self-pay | Admitting: Family Medicine

## 2018-05-13 VITALS — BP 126/73 | HR 88 | Temp 98.1°F | Resp 16 | Ht 65.25 in | Wt 142.0 lb

## 2018-05-13 DIAGNOSIS — I808 Phlebitis and thrombophlebitis of other sites: Secondary | ICD-10-CM

## 2018-05-13 NOTE — Patient Instructions (Addendum)
If you have lab work done today you will be contacted with your lab results within the next 2 weeks.  If you have not heard from [place] then please contact us. The fastest way to get your results is to register for My Chart.   IF you received an x-ray today, you will receive an invoice from [facility]. Please contact [facility] at [phone] with questions or concerns regarding your invoice.   IF you received labwork today, you will receive an invoice from [place]. Please contact [facility] at [phone] with questions or concerns regarding your invoice.   Our billing staff will not be able to assist you with questions regarding bills from these companies.  You will be contacted with the lab results as soon as they are available. The fastest way to get your results is to activate your My Chart account. Instructions are located on the last page of this paperwork. If you have not heard from [place] regarding the results in 2 weeks, please contact this office.    We recommend that you schedule a mammogram for breast cancer screening. Typically, you do not need a referral to do this. Please contact a local imaging center to schedule your mammogram.  [facility] - [phone]  *ask for the Radiology Department [facility] [place] [facility] - [phone] or [phone]  [facility] - [phone] [place] [phone] [facility] - [phone]  *ask for the [facility] - [phone]  *ask for the Radiology Department [facility] - [phone]  *ask for the [place] - [phone]  Flebitis Phlebitis La flebitis es el dolor y la hinchazn (inflamacin) de una vena. Ocurre en los brazos, las piernas o el torso (tronco), como tambin en el interior del [place]. Normalmente, la flebitis no es grave cuando ocurre cerca de la  superficie del cuerpo. No obstante, puede causar problemas graves cuando ocurre en mayor profundidad en el organismo. Cules son las causas? Algunas de las causas de la flebitis pueden ser las siguientes:  Tener una [place], una va intravenosa (IV) o un tubo delgado y Maryln Gottron (catter) en una vena.  Recibir ciertos medicamentos o soluciones a travs de un tubo (catter) intravenoso. Algunos medicamentos o soluciones, como los antibiticos y los medicamentos contra el cncer, pueden irritar la vena.  Tener un tubo (catter) intravenoso durante [employer].  Tener una va intravenosa (IV) en una parte de la mano o el brazo que se [place].  Un cogulo de [place].  Una infeccin en la vena.  Clementeen Hoof en una vena. Qu incrementa el riesgo? Los siguientes factores pueden hacer que usted sea ms propenso a tener esta afeccin:  Tener exceso de [place] u obesidad.  Embarazo.  Cncer.  Disminucin o enlentecimiento del flujo sanguneo por las venas. Esto puede ser consecuencia de: ? Reposo en cama por un largo perodo. ? Viajes de Orthoptist. ? Lesiones. ? Ciruga. ? Insuficiencia cardaca congestiva. ? Inactividad.  Fumar.  Uso de pldoras anticonceptivas o terapia de reemplazo hormonal.  Venas varicosas.  Enfermedades inflamatorias o trastornos de la sangre que aumentan la coagulacin.  Consumo de drogas que se administren por va intravenosa.  Antecedentes personales de cogulos de sangre. Cules son los signos o los sntomas? Los sntomas de esta afeccin incluyen los siguientes:  Zona roja, sensible, hinchada y dolorosa en la piel. Por lo general, esta zona es  larga y [place], y podra extenderse.  La zona afectada est caliente al tacto.  Dureza a lo largo del centro de la zona afectada.  Fiebre baja. Cmo se diagnostica? Esta afeccin se diagnostica en funcin de lo siguiente:  Sus sntomas.  Un examen fsico.  Sus antecedentes mdicos, incluidos  los antecedentes familiares. Se podran realizar otros exmenes para descartar otras afecciones, como cogulos de [place], en especial, si tiene antecedentes o un riesgo alto de que se formen. Estos pueden incluir lo siguiente:  Anlisis de sangre.  Ecografa.  Pruebas genticas.  Biopsia. Cuando se toma Truddie Coco de tejido del cuerpo para examinarla. Esto es poco frecuente. Cmo se trata? El tratamiento depende de la gravedad de la afeccin, as como del Environmental consultant de la inflamacin. En la [place], la afeccin no es grave, y la persona mejora rpidamente. El tratamiento puede incluir lo siguiente:  Glass blower/designer compresas calientes o almohadillas trmicas.  Usar medias o vendajes de compresin.  Medicamentos, por ejemplo: ? Medicamentos antiinflamatorios. ? Antibiticos si hay una infeccin. ? Anticoagulantes si tiene un cogulo de sangre o si se sospecha la presencia de [place], o si tiene antecedentes de cogulos de [place] o de un trastorno de Herbalist.  Extraccin de un tubo (catter) intravenoso que pudiera ser la causa del problema.  Usar un medicamento o una solucin diferentes que no irritarn la vena. En casos poco frecuentes, es posible que se necesite una ciruga para extraer la seccin daada de la vena. Siga estas indicaciones en su casa: Control del dolor, la rigidez y la hinchazn  Si se lo indican, aplique calor en la zona afectada con la frecuencia que le haya indicado el mdico. Use la fuente de calor que el mdico le recomiende, como una compresa de calor hmedo o una almohadilla trmica. ? Coloque una Gen[employer] piel y la fuente de Freight forwarder. ? Aplique el calor durante 20 a 76minutos. ? Retire la fuente de calor si la piel se pone de color rojo brillante. Esto es muy importante si no puede Education officer, environmental, calor o fro. Puede correr un riesgo mayor de sufrir quemaduras.  Cuando est sentado o acostado, levante (eleve) la zona afectada por encima del nivel del  corazn. Medicamentos  Delphi de venta libre y los recetados solamente como se lo haya indicado el mdico.  Si le recetaron un antibitico, tmelo como se lo haya indicado el mdico. No deje de usar el antibitico aunque comience a Sports administrator.  Lleve una tarjeta de alerta mdica o use su brazalete de alerta mdica para demostrar que est tomando anticoagulantes. Instrucciones generales   Si la flebitis ocurre en las piernas: ? Government social research officer sentado o parado por mucho tiempo. ? [place]. ? Es importante que camine un poco cada tanto para no estar sentado durante largos perodos. ? Investment banker, corporate reposo en cama por mucho tiempo. Las horas de sueo habituales no son parte del reposo en cama.  Use las medias de compresin como se lo haya indicado el mdico. Estas medias ayudan a Mining engineer formacin de cogulos de [place] y a reducir la hinchazn de las piernas. Tambin pueden ayudar a prevenir la recurrencia de la afeccin.  No consuma ningn producto que contenga nicotina o tabaco, como cigarrillos y Psychologist, sport and exercise. Si necesita ayuda para dejar de fumar, consulte al mdico.  Concurra a todas las visitas de control como se lo haya indicado el mdico. Esto es importante. Esto podra incluir  todos los anlisis de sangre de [place]. Comunquese con un mdico si:  Nota hematomas inusuales o tiene hemorragias.  Los sntomas no se [place] o empeoran.  Toma antiinflamatorios y comienza a Education officer, environmental abdominal. Solicite ayuda de inmediato si:  De repente, siente dolor en el pecho o tiene dificultad para respirar.  Tiene fiebre, y los sntomas empeoran repentinamente.  Tose y escupe sangre.  Se siente mareado o se desmaya.  Tiene hinchazn o dolor intenso en el brazo o la pierna afectados. Estos sntomas pueden representar un problema grave que constituye Engineer, maintenance (IT). No espere hasta que los sntomas desaparezcan. Solicite  atencin mdica de inmediato. Comunquese con el servicio de emergencias de su localidad (911 en los [place]). No conduzca por sus propios medios [employer] hospital. Resumen  La flebitis es el dolor y la hinchazn (inflamacin) de una vena.  Normalmente, la flebitis no es grave cuando ocurre cerca de la superficie del cuerpo. No obstante, puede causar problemas graves cuando ocurre en mayor profundidad en el organismo.  El tratamiento depende de la gravedad de la afeccin y del Environmental consultant de la inflamacin.  Cuando est sentado o acostado, levante (eleve) la zona afectada por encima del nivel del corazn. Esta informacin no tiene Marine scientist el consejo del mdico. Asegrese de hacerle al mdico cualquier pregunta que tenga. Document Released: 01/24/2005 Document Revised: 01/07/2017 Document Reviewed: 01/07/2017 Elsevier Interactive Patient Education  2019 [employer].

## 2018-05-13 NOTE — Progress Notes (Signed)
Established Patient Office Visit  Subjective:  Patient ID: Debbie Campos, female    DOB: 04-09-1981  Age: 38 y.o. MRN: [id number]  CC:  Chief Complaint  Patient presents with  . pinky finger left hand swollen red and tinge of purple x 3 d    HPI Debbie Campos presents for   Interpreter ID number - [employer] [phone]   Patient reports that she has left little finger swelling and pain for 3 days It is red and swollen Last Friday it felt like something on the vein that was hurting and swollen She notices pain in the veins for example when she grasps a mop  Past Medical History:  Diagnosis Date  . Bell's palsy 2011   Told it was due to stress, spontaneously resolved  . Environmental allergies   . Infertility, female     Past Surgical History:  Procedure Laterality Date  . none      Family History  Problem Relation Age of Onset  . Diabetes Paternal Grandmother     Social History   Socioeconomic History  . Marital status: Married    Spouse name: Not on file  . Number of children: Not on file  . Years of education: Not on file  . Highest education level: Not on file  Occupational History  . Not on file  Social Needs  . Financial resource strain: Not on file  . Food insecurity:    Worry: Not on file    Inability: Not on file  . Transportation needs:    Medical: Not on file    Non-medical: Not on file  Tobacco Use  . Smoking status: Never Smoker  . Smokeless tobacco: Never Used  Substance and Sexual Activity  . Alcohol use: No  . Drug use: No  . Sexual activity: Not Currently    Partners: Male    Birth control/protection: None    Comment: 1ST INTERCOURSE- 60, PARTNERS- 2, CURRENT PARTNER- 6 YRS   Lifestyle  . Physical activity:    Days per week: Not on file    Minutes per session: Not on file  . Stress: Not on file  Relationships  . Social connections:    Talks on phone: Not on file    Gets together: Not on file   Attends religious service: Not on file    Active member of club or organization: Not on file    Attends meetings of clubs or organizations: Not on file    Relationship status: Not on file  . Intimate partner violence:    Fear of current or ex partner: Not on file    Emotionally abused: Not on file    Physically abused: Not on file    Forced sexual activity: Not on file  Other Topics Concern  . Not on file  Social History Narrative   Works part time   [place] about [place] from a friend   Primary language is spanish     No outpatient medications prior to visit.   No facility-administered medications prior to visit.     No Known Allergies  ROS Review of Systems No fevers or chills No numbness or tingling    Objective:    Physical Exam  BP 126/73 (BP Location: Right Arm, Patient Position: Sitting, Cuff Size: Normal)   Pulse 88   Temp 98.1 F (36.7 C) (Oral)   Resp 16   Ht 5' 5.25" (1.657 m)   Wt 142 lb (64.4 kg)   SpO2 98%  BMI 23.45 kg/m  Wt Readings from Last 3 Encounters:  05/13/18 142 lb (64.4 kg)  03/12/17 134 lb 12.8 oz (61.1 kg)  03/07/17 134 lb (60.8 kg)   Radial pulses normal Thenar compartment normal Capillary refill <3s Visible superficial blue veins Normal lumbrical exam and normal range of motion  Health Maintenance Due  Topic Date Due  . HIV Screening  10/07/1993  . INFLUENZA VACCINE  11/28/2017    There are no preventive care reminders to display for this patient.  Lab Results  Component Value Date   TSH 1.24 03/07/2017   Lab Results  Component Value Date   WBC 5.4 03/07/2017   HGB 13.3 03/07/2017   HCT 39.6 03/07/2017   MCV 85.2 03/07/2017   PLT 228 03/07/2017   Lab Results  Component Value Date   NA 138 03/07/2017   K 4.2 03/07/2017   CO2 26 03/07/2017   GLUCOSE 90 03/07/2017   BUN 12 03/07/2017   CREATININE 0.58 03/07/2017   BILITOT 0.4 03/07/2017   ALKPHOS 68 03/15/2016   AST 14 03/07/2017   ALT 18 03/07/2017   PROT 7.2  03/07/2017   ALBUMIN 4.5 03/15/2016   CALCIUM 9.2 03/07/2017   Lab Results  Component Value Date   CHOL 155 03/07/2017   Lab Results  Component Value Date   HDL 38 (L) 03/07/2017   Lab Results  Component Value Date   LDLCALC 92 03/07/2017   Lab Results  Component Value Date   TRIG 153 (H) 03/07/2017   Lab Results  Component Value Date   CHOLHDL 4.1 03/07/2017   No results found for: HGBA1C    Assessment & Plan:   Problem List Items Addressed This Visit    None    Visit Diagnoses    Superficial thrombophlebitis of left upper extremity    -  Primary     ibuprofen advised Supportive care Warm compress prn  No orders of the defined types were placed in this encounter.   Follow-up: Return if symptoms worsen or fail to improve.    Forrest Moron, MD

## 2018-07-17 ENCOUNTER — Encounter: Payer: Self-pay | Admitting: Obstetrics & Gynecology

## 2018-07-17 ENCOUNTER — Other Ambulatory Visit: Payer: Self-pay

## 2018-07-17 ENCOUNTER — Ambulatory Visit (INDEPENDENT_AMBULATORY_CARE_PROVIDER_SITE_OTHER): Payer: Self-pay | Admitting: Obstetrics & Gynecology

## 2018-07-17 VITALS — BP 120/70 | Ht 65.5 in | Wt 139.0 lb

## 2018-07-17 DIAGNOSIS — Z01419 Encounter for gynecological examination (general) (routine) without abnormal findings: Secondary | ICD-10-CM

## 2018-07-17 DIAGNOSIS — N898 Other specified noninflammatory disorders of vagina: Secondary | ICD-10-CM

## 2018-07-17 DIAGNOSIS — Z113 Encounter for screening for infections with a predominantly sexual mode of transmission: Secondary | ICD-10-CM

## 2018-07-17 DIAGNOSIS — N979 Female infertility, unspecified: Secondary | ICD-10-CM

## 2018-07-17 LAB — WET PREP FOR TRICH, YEAST, CLUE

## 2018-07-17 NOTE — Addendum Note (Signed)
Addended by: Thurnell Garbe A on: 07/17/2018 09:08 AM   Modules accepted: Orders

## 2018-07-17 NOTE — Patient Instructions (Signed)
1. Encounter for routine gynecological examination with Papanicolaou smear of cervix Normal gynecologic exam except for mildly increased vaginal discharge.  Pap reflex done.  Breast exam normal.  Recommend a screening mammogram at age 38 this year.  Fasting health labs done today here.  Vitamin D at 34 last year, no need to repeat.  Good body mass index at 22.78.  Continue with fitness and healthy nutrition. - CBC - Comp Met (CMET) - Lipid panel - TSH  2. Primary female infertility Would like to conceive, but has been does not want to investigate further or treat.  3. Vaginal discharge Wet prep within normal limits except for increased white blood cells.  Awaiting gonorrhea and Chlamydia results. - WET PREP FOR [place], YEAST, CLUE  Other orders - Multiple Vitamins-Minerals (HAIR SKIN AND NAILS FORMULA) TABS; Take by mouth.  Lorna Few, fue un placer verle hoy!  Voy a informarle de sus [employer].

## 2018-07-17 NOTE — Progress Notes (Signed)
Debbie Campos 1980-06-20 [id number]   History:    38 y.o. G0 Married  RP:  Established patient presenting for annual gyn exam   HPI: Menstrual periods every month with normal flow.  Mild dysmenorrhea.  No breakthrough bleeding.  No pelvic pain.  Feels dry vaginally with intercourse after her menstrual, not at other times.  History of primary infertility, tried Clomid stimulation with IUI's before.  Husband does not want to further investigate or treat.  Breast normal.  Good body mass index at 22.78.  Good fitness.  Healthy nutrition.  Fasting health labs here today.  Past medical history,surgical history, family history and social history were all reviewed and documented in the EPIC chart.  Gynecologic History Patient's last menstrual period was 07/12/2018. Contraception: Primary infertility, declines contraception Last Pap: 02/2016 . Results were: Negative/HPV HR neg. Last mammogram: Never Bone Density: Never Colonoscopy: Never  Obstetric History OB History  Gravida Para Term Preterm AB Living  0            SAB TAB Ectopic Multiple Live Births                ROS: A ROS was performed and pertinent positives and negatives are included in the history.  GENERAL: No fevers or chills. HEENT: No change in vision, no earache, sore throat or sinus congestion. NECK: No pain or stiffness. CARDIOVASCULAR: No chest pain or pressure. No palpitations. PULMONARY: No shortness of breath, cough or wheeze. GASTROINTESTINAL: No abdominal pain, nausea, vomiting or diarrhea, melena or bright red blood per rectum. GENITOURINARY: No urinary frequency, urgency, hesitancy or dysuria. MUSCULOSKELETAL: No joint or muscle pain, no back pain, no recent trauma. DERMATOLOGIC: No rash, no itching, no lesions. ENDOCRINE: No polyuria, polydipsia, no heat or cold intolerance. No recent change in weight. HEMATOLOGICAL: No anemia or easy bruising or bleeding. NEUROLOGIC: No headache, seizures, numbness,  tingling or weakness. PSYCHIATRIC: No depression, no loss of interest in normal activity or change in sleep pattern.     Exam:   BP 120/70   Ht 5' 5.5" (1.664 m)   Wt 139 lb (63 kg)   LMP 07/12/2018   BMI 22.78 kg/m   Body mass index is 22.78 kg/m.  General appearance : Well developed well nourished female. No acute distress HEENT: Eyes: no retinal hemorrhage or exudates,  Neck supple, trachea midline, no carotid bruits, no thyroidmegaly Lungs: Clear to auscultation, no rhonchi or wheezes, or rib retractions  Heart: Regular rate and rhythm, no murmurs or gallops Breast:Examined in sitting and supine position were symmetrical in appearance, no palpable masses or tenderness,  no skin retraction, no nipple inversion, no nipple discharge, no skin discoloration, no axillary or supraclavicular lymphadenopathy Abdomen: no palpable masses or tenderness, no rebound or guarding Extremities: no edema or skin discoloration or tenderness  Pelvic: Vulva: Normal             Vagina: No gross lesions.  Increased discharge.  Wet prep done.  Cervix: No gross lesions or discharge.  Pap reflex/Gono-Chlam done  Uterus  AV, normal size, shape and consistency, non-tender and mobile  Adnexa  Without masses or tenderness  Anus: Normal  Wet prep:  No yeast, no BV.  WBCs present.  Assessment/Plan:  38 y.o. female for annual exam   1. Encounter for routine gynecological examination with Papanicolaou smear of cervix Normal gynecologic exam except for mildly increased vaginal discharge.  Pap reflex done.  Breast exam normal.  Recommend a screening mammogram at age 36  this year.  Fasting health labs done today here.  Vitamin D at 62 last year, no need to repeat.  Good body mass index at 22.78.  Continue with fitness and healthy nutrition. - CBC - Comp Met (CMET) - Lipid panel - TSH  2. Primary female infertility Would like to conceive, but has been does not want to investigate further or treat.  3.  Vaginal discharge Wet prep within normal limits except for increased white blood cells.  Awaiting gonorrhea and Chlamydia results. - WET PREP FOR [place], YEAST, CLUE  Other orders - Multiple Vitamins-Minerals (HAIR SKIN AND NAILS FORMULA) TABS; Take by mouth.  Princess Bruins MD, 8:24 AM 07/17/2018

## 2018-07-18 LAB — COMPREHENSIVE METABOLIC PANEL
AG RATIO: 1.7 (calc) (ref 1.0–2.5)
ALT: 18 U/L (ref 6–29)
AST: 20 U/L (ref 10–30)
Albumin: 4.6 g/dL (ref 3.6–5.1)
Alkaline phosphatase (APISO): 70 U/L (ref 31–125)
BUN: 14 mg/dL (ref 7–25)
CO2: 26 mmol/L (ref 20–32)
Calcium: 9.1 mg/dL (ref 8.6–10.2)
Chloride: 104 mmol/L (ref 98–110)
Creat: 0.58 mg/dL (ref 0.50–1.10)
GLUCOSE: 84 mg/dL (ref 65–99)
Globulin: 2.7 g/dL (calc) (ref 1.9–3.7)
Potassium: 3.7 mmol/L (ref 3.5–5.3)
Sodium: 137 mmol/L (ref 135–146)
Total Bilirubin: 0.3 mg/dL (ref 0.2–1.2)
Total Protein: 7.3 g/dL (ref 6.1–8.1)

## 2018-07-18 LAB — TSH: TSH: 1.19 mIU/L

## 2018-07-18 LAB — LIPID PANEL
Cholesterol: 153 mg/dL (ref <200)
HDL: 41 mg/dL — ABNORMAL LOW (ref >=50)
LDL Cholesterol (Calc): 88 mg/dL (calc)
Non-HDL Cholesterol (Calc): 112 mg/dL (calc) (ref <130)
Total CHOL/HDL Ratio: 3.7 (calc) (ref <5.0)
Triglycerides: 140 mg/dL (ref <150)

## 2018-07-18 LAB — PAP THINPREP ASCUS RFLX HPV RFLX TYPE
C. trachomatis RNA, TMA: NOT DETECTED
N. gonorrhoeae RNA, TMA: NOT DETECTED

## 2018-07-18 LAB — CBC
HCT: 36.7 % (ref 35.0–45.0)
Hemoglobin: 12.3 g/dL (ref 11.7–15.5)
MCH: 27.8 pg (ref 27.0–33.0)
MCHC: 33.5 g/dL (ref 32.0–36.0)
MCV: 83 fL (ref 80.0–100.0)
MPV: 9.7 fL (ref 7.5–12.5)
Platelets: 245 10*3/uL (ref 140–400)
RBC: 4.42 10*6/uL (ref 3.80–5.10)
RDW: 14.1 % (ref 11.0–15.0)
WBC: 4.2 10*3/uL (ref 3.8–10.8)

## 2018-11-10 ENCOUNTER — Encounter: Payer: Self-pay | Admitting: Emergency Medicine

## 2018-11-10 ENCOUNTER — Telehealth (INDEPENDENT_AMBULATORY_CARE_PROVIDER_SITE_OTHER): Payer: Self-pay | Admitting: Emergency Medicine

## 2018-11-10 ENCOUNTER — Other Ambulatory Visit: Payer: Self-pay

## 2018-11-10 VITALS — Wt 143.0 lb

## 2018-11-10 DIAGNOSIS — Z20828 Contact with and (suspected) exposure to other viral communicable diseases: Secondary | ICD-10-CM

## 2018-11-10 DIAGNOSIS — Z20822 Contact with and (suspected) exposure to covid-19: Secondary | ICD-10-CM

## 2018-11-10 NOTE — Progress Notes (Signed)
Telemedicine Encounter- SOAP NOTE Established Patient  This telephone encounter was conducted with the patient's (or proxy's) verbal consent via audio telecommunications: yes/no: Yes Patient was instructed to have this encounter in a suitably private space; and to only have persons present to whom they give permission to participate. In addition, patient identity was confirmed by use of name plus two identifiers (DOB and address).  I discussed the limitations, risks, security and privacy concerns of performing an evaluation and management service by telephone and the availability of in person appointments. I also discussed with the patient that there may be a patient responsible charge related to this service. The patient expressed understanding and agreed to proceed.  I spent a total of TIME; 0 MIN TO 60 MIN: 10 minutes talking with the patient or their proxy.  No chief complaint on file. Exposed to Debbie Campos is a 38 y.o. female established patient. Telephone visit today concerns because she was exposed to [place] infected friends during 4 July weekend.  Asymptomatic.  Wondering if she needs to be tested.  HPI   There are no active problems to display for this patient.   Past Medical History:  Diagnosis Date  . Bell's palsy 2011   Told it was due to stress, spontaneously resolved  . Environmental allergies   . Infertility, female     Current Outpatient Medications  Medication Sig Dispense Refill  . Ascorbic Acid (VITAMIN C PO) Take by mouth daily.    . Multiple Vitamins-Minerals (HAIR SKIN AND NAILS FORMULA) TABS Take by mouth.     No current facility-administered medications for this visit.     No Known Allergies  Social History   Socioeconomic History  . Marital status: Married    Spouse name: Not on file  . Number of children: Not on file  . Years of education: Not on file  . Highest education level: Not on file  Occupational  History  . Not on file  Social Needs  . Financial resource strain: Not on file  . Food insecurity    Worry: Not on file    Inability: Not on file  . Transportation needs    Medical: Not on file    Non-medical: Not on file  Tobacco Use  . Smoking status: Never Smoker  . Smokeless tobacco: Never Used  Substance and Sexual Activity  . Alcohol use: No  . Drug use: No  . Sexual activity: Not Currently    Partners: Male    Birth control/protection: None    Comment: 1ST INTERCOURSE- 58, PARTNERS- 2, CURRENT PARTNER- 6 YRS   Lifestyle  . Physical activity    Days per week: Not on file    Minutes per session: Not on file  . Stress: Not on file  Relationships  . Social Herbalist on phone: Not on file    Gets together: Not on file    Attends religious service: Not on file    Active member of club or organization: Not on file    Attends meetings of clubs or organizations: Not on file    Relationship status: Not on file  . Intimate partner violence    Fear of current or ex partner: Not on file    Emotionally abused: Not on file    Physically abused: Not on file    Forced sexual activity: Not on file  Other Topics Concern  . Not on file  Social History Narrative  Works part time   [employer] about [place] from a friend   Primary language is spanish     Review of Systems  Constitutional: Negative.  Negative for chills and fever.  HENT: Negative.  Negative for congestion, nosebleeds and sore throat.   Eyes: Negative.   Respiratory: Negative.  Negative for cough and shortness of breath.   Cardiovascular: Negative.  Negative for chest pain and palpitations.  Gastrointestinal: Negative for abdominal pain, diarrhea, nausea and vomiting.  Genitourinary: Negative for dysuria.  Musculoskeletal: Negative for myalgias and neck pain.  Skin: Negative.  Negative for rash.  Neurological: Negative for dizziness and headaches.  All other systems reviewed and are negative.   Objective    Vitals as reported by the patient: Today's Vitals   11/10/18 1629  Weight: 143 lb (64.9 kg)  Alert and oriented x3 in no apparent respiratory distress.  There are no diagnoses linked to this encounter. Diagnoses and all orders for this visit:  Exposure to Covid-19 Virus  Clinically stable and asymptomatic.  No medical concerns identified during this visit. COVID precautions given.   I discussed the assessment and treatment plan with the patient. The patient was provided an opportunity to ask questions and all were answered. The patient agreed with the plan and demonstrated an understanding of the instructions.   The patient was advised to call back or seek an in-person evaluation if the symptoms worsen or if the condition fails to improve as anticipated.  I provided 10 minutes of non-face-to-face time during this encounter.  Horald Pollen, MD  Primary Care at [facility]

## 2018-11-10 NOTE — Progress Notes (Signed)
Called patient to triage for appointment. Patient states her friend on 11/01/2018 had chills and her husband symptoms were worse. Patient states her friend is waiting on the COVID19 test results. Patient states she had a cough for 2 days after being around the friend.

## 2019-02-03 ENCOUNTER — Other Ambulatory Visit: Payer: Self-pay

## 2019-02-03 ENCOUNTER — Encounter: Payer: Self-pay | Admitting: Obstetrics & Gynecology

## 2019-02-03 ENCOUNTER — Ambulatory Visit (INDEPENDENT_AMBULATORY_CARE_PROVIDER_SITE_OTHER): Payer: Self-pay | Admitting: Obstetrics & Gynecology

## 2019-02-03 VITALS — BP 128/82

## 2019-02-03 DIAGNOSIS — N61 Mastitis without abscess: Secondary | ICD-10-CM

## 2019-02-03 MED ORDER — NYSTATIN-TRIAMCINOLONE 100000-0.1 UNIT/GM-% EX OINT: 1.0000 "application " | TOPICAL_OINTMENT | Freq: Two times a day (BID) | CUTANEOUS | 1 refills | Status: AC

## 2019-02-03 NOTE — Progress Notes (Signed)
    Debbie Campos April 12, 1981 [id number]        38 y.o.  G0P0   RP: Rt nipple/breast itching x 1 month  HPI: Rt nipple/breast itching x 1 month with redness.  Better x 1 week.  No breast pain.  No nipple discharge.  No lump felt.  No fever.   OB History  Gravida Para Term Preterm AB Living  0            SAB TAB Ectopic Multiple Live Births               Past medical history,surgical history, problem list, medications, allergies, family history and social history were all reviewed and documented in the EPIC chart.   Directed ROS with pertinent positives and negatives documented in the history of present illness/assessment and plan.  Exam:  Vitals:   02/03/19 1549  BP: 128/82   General appearance:  Normal  Breast exam:  Left breast and Lt axilla normal.                          Right breast:  No nodule/mass felt.  No nipple discharge.  NT.  Mild erythema around nipple/aureola.     Assessment/Plan:  38 y.o. G0P0   1. Nipple inflammation Skin inflammation at the right nipple and breast.  Origin of inflammation unknown, but no breast nodule/mass felt.  Will treat with Mycolog.  Usage reviewed and prescription sent to pharmacy.  Screening mammogram to schedule at patient is 38 yo.  Other orders - nystatin-triamcinolone ointment (MYCOLOG); Apply 1 application topically 2 (two) times daily for 14 days.  Counseling on above issues and coordination of care >50 % x 15 minutes.  Princess Bruins MD, 4:09 PM 02/03/2019

## 2019-02-06 ENCOUNTER — Encounter: Payer: Self-pay | Admitting: Obstetrics & Gynecology

## 2019-02-06 NOTE — Patient Instructions (Signed)
1. Nipple inflammation Skin inflammation at the right nipple and breast.  Origin of inflammation unknown, but no breast nodule/mass felt.  Will treat with Mycolog.  Usage reviewed and prescription sent to pharmacy.  Screening mammogram to schedule at patient is 38 yo.  Other orders - nystatin-triamcinolone ointment (MYCOLOG); Apply 1 application topically 2 (two) times daily for 14 days.  Glennda, it was a pleasure seeing you today!

## 2019-02-19 ENCOUNTER — Ambulatory Visit (INDEPENDENT_AMBULATORY_CARE_PROVIDER_SITE_OTHER): Payer: Self-pay | Admitting: Family Medicine

## 2019-02-19 ENCOUNTER — Other Ambulatory Visit: Payer: Self-pay

## 2019-02-19 ENCOUNTER — Encounter: Payer: Self-pay | Admitting: Family Medicine

## 2019-02-19 VITALS — BP 121/72 | HR 94 | Temp 98.0°F | Resp 16 | Ht 65.55 in | Wt 139.0 lb

## 2019-02-19 DIAGNOSIS — M26609 Unspecified temporomandibular joint disorder, unspecified side: Secondary | ICD-10-CM

## 2019-02-19 DIAGNOSIS — F5104 Psychophysiologic insomnia: Secondary | ICD-10-CM

## 2019-02-19 MED ORDER — HYDROXYZINE HCL 25 MG PO TABS: 25.0000 mg | ORAL_TABLET | Freq: Every evening | ORAL | 1 refills | Status: DC | PRN

## 2019-02-19 MED ORDER — CYCLOBENZAPRINE HCL 10 MG PO TABS: 10.0000 mg | ORAL_TABLET | Freq: Three times a day (TID) | ORAL | 2 refills | Status: DC | PRN

## 2019-02-19 NOTE — Patient Instructions (Addendum)
If you have lab work done today you will be contacted with your lab results within the next 2 weeks.  If you have not heard from [place] then please contact us. The fastest way to get your results is to register for My Chart.   IF you received an x-ray today, you will receive an invoice from [facility]. Please contact [facility] at [phone] with questions or concerns regarding your invoice.   IF you received labwork today, you will receive an invoice from [place]. Please contact [facility] at [phone] with questions or concerns regarding your invoice.   Our billing staff will not be able to assist you with questions regarding bills from these companies.  You will be contacted with the lab results as soon as they are available. The fastest way to get your results is to activate your My Chart account. Instructions are located on the last page of this paperwork. If you have not heard from [place] regarding the results in 2 weeks, please contact this office.      Vivir con ansiedad Living With Anxiety  Despus de haber sido diagnosticado con trastorno de ansiedad, podra sentirse aliviado por comprender por qu se haba sentido o haba actuado de cierto modo. Adems, es natural sentirse abrumado por el tratamiento que tiene por delante y por lo que este significar para su vida. Con atencin y [place], [place] trastorno y Marine scientist. Cmo hacer frente a la ansiedad Enfrentar el estrs El estrs es la reaccin del cuerpo ante los cambios y los acontecimientos de la vida, tanto buenos [place] Comunidad. El estrs puede durar solo algunas horas o puede ser [place]. El estrs puede influir mucho en la ansiedad, por lo que es importante aprender sobre cmo hacerle frente y cmo pensarlo de un modo nuevo. Hable con el mdico o un orientador psicolgico para obtener ms informacin sobre cmo Software engineer. Podran sugerirle algunas tcnicas para hacerlo,  como:  Musicoterapia. Esto podra incluir crear o escuchar msica que disfrute y lo inspire.  Meditacin consciente. Esto implica prestar atencin a la respiracin normal ms que intentar controlarla. Puede realizarse mientras est sentado o camina.  Oracin centrante. Este es un tipo de meditacin que implica centrarse en una palabra, frase o imagen sagrada que le sea representativa y le genere paz.  Respiracin profunda. Para hacer esto, expanda el estmago e inhale lentamente por la nariz. Mantenga el aire durante un lapso de [place]. Luego, exhale lentamente mientras deja que los msculos del estmago se relajen.  Dilogo interno. Se trata de una habilidad por la que usted es capaz de identificar patrones de pensamiento que lo llevan a Best boy reacciones ansiosas y de corregir dichos pensamientos.  Relajacin muscular. Esto implica tensar los msculos y, [place], [place]. Elija una tcnica para reducir el estrs que se adapte a su estilo de vida y su personalidad. Las tcnicas para reducir el estrs llevan tiempo y Location manager. Resrvese de 5a65minutos por da para Ambulance person. Algunos terapeutas pueden ofrecerle capacitacin para aprenderlas. Es posible que algunos planes de seguro mdico cubran la capacitacin. Otras cosas que puede hacer para manejar el estrs:  Catering manager un registro del estrs. Esto puede ayudarlo a Financial planner estrs y modos de Chief Technology Officer su reaccin.  Pensar en cmo reacciona ante ciertas situaciones. Es posible que no sea capaz de Chief Technology Officer todo, pero puede controlar su reaccin.  Hacerse tiempo para las actividades que lo ayudan a Nurse, children's y no sentir culpa por pasar  su tiempo de [employer]. La terapia en combinacin con las habilidades para enfrentar y reducir el estrs proporciona la mejor alternativa para un tratamiento satisfactorio. Medicamentos Los medicamentos pueden ayudar a [employer]. Algunos medicamentos para la  ansiedad:  Teacher, adult education ansiedad.  Antidepresivos.  Betabloqueantes. Es posible que se requieran medicamentos, junto con la terapia, si otros tratamientos no dieron [place]. Un mdico debe recetar los medicamentos. [place] interpersonales pueden ser muy importantes para ayudar a su recuperacin. Intente pasar ms tiempo interactuando con amigos y familiares de [place]. Considere la posibilidad de ir a terapia de pareja, tomar clases de educacin familiar o ir a Careers information officer. La terapia puede ayudarlos a usted y a los dems a comprender mejor el trastorno. Cmo reconocer cambios en el trastorno Todos tienen una respuesta diferente al tratamiento de la ansiedad. Se dice que est recuperado de la ansiedad cuando los sntomas disminuyen y dejan de Cabin crew en las actividades diarias en el hogar o [place]. Esto podra significar que usted comenzar a Field seismologist lo siguiente:  Associate Professor y atencin.  Dormir mejor.  Estar menos irritable.  Tener ms energa.  Tener [employer]. Es Glass blower/designer cundo el trastorno [place]. Comunquese con el mdico si sus sntomas interfieren en su hogar o su trabajo, y usted no siente que el trastorno est mejorando. Dnde encontrar ayuda y 59: Puede conseguir ayuda y [employer] siguientes lugares:  Grupos de [place].  Organizaciones comunitarias y en lnea.  Un lder espiritual de confianza.  Terapia de pareja.  Clases de educacin familiar.  Terapia familiar. Siga estas instrucciones en su casa:  Consuma una dieta saludable que incluya abundantes frutas, verduras, cereales integrales, productos lcteos descremados y protenas magras. No consuma muchos alimentos con alto contenido de grasas slidas, azcares agregados o sal.  Actividad fsica. [employer] de los adultos debe hacer lo siguiente: ? Optometrist, al [place], 138minutos de actividad fsica por semana. El ejercicio debe  aumentar la frecuencia cardaca y Nature conservation officer transpirar (ejercicio de intensidad moderada). ? Realizar ejercicios de fortalecimiento por lo [employer] por semana.  Disminuir el consumo de cafena, tabaco, alcohol y otras sustancias potencialmente dainas.  Dormir el tiempo adecuado y de Cabin crew. [employer] de los adultos necesitan entre 7y9horas de sueo todas las noches.  Opte por cosas que le simplifiquen la vida.  Tome los medicamentos de venta libre y los recetados solamente como se lo haya indicado el mdico.  Evite el consumo de cafena, alcohol y ciertos medicamentos contra el resfro de venta sin receta. Estos podran Engineer, building services. Pregntele al farmacutico qu medicamentos no debera tomar.  Concurra a todas las visitas de control como se lo haya indicado el mdico. Esto es importante. Preguntas para hacerle al mdico  Me ser til la terapia?  Con qu frecuencia debo visitar a un mdico para el seguimiento?  Durante cunto tiempo tendr [employer]?  Tienen efectos secundarios a [employer] tomo?  Existe una alternativa que remplace los medicamentos? Comunquese con un mdico si:  Le resulta difcil permanecer concentrado o finalizar las tareas diarias.  Pasa muchas horas por da sintindose preocupado por la vida cotidiana.  La preocupacin le provoca un cansancio extremo.  Comienza a tener dolores de [place] o nuseas, o a sentirse tenso.  Orina ms de lo normal.  Tiene diarrea. Solicite ayuda de inmediato si:  Se le acelera la frecuencia cardaca y Industrial/product designer.  Tiene pensamientos acerca de Glass blower/designer o daar a Producer, television/film/video. Si alguna vez siente que puede lastimarse o Market researcher a los dems, o tiene pensamientos de poner fin a su vida, busque ayuda de inmediato. Puede dirigirse al servicio de urgencias ms cercano o comunicarse con:  El servicio de Multimedia programmer de su localidad (911 en los  Estados Unidos).  Una lnea de asistencia al suicida y Freight forwarder en crisis, como la [employer] de Prevencin del Suicidio (National Suicide Prevention Lifeline) al [phone]. Est disponible las 24 horas del da. Resumen  Tomar medidas para enfrentar el estrs puede calmarlo.  Los medicamentos no pueden curar los trastornos de [place], Armed forces training and education officer pueden ayudar a [employer].  Los familiares, los amigos y las parejas pueden tener un lugar importante en su recuperacin del trastorno de ansiedad. Esta informacin no tiene Marine scientist el consejo del mdico. Asegrese de hacerle al mdico cualquier pregunta que tenga. Document Released: 07/24/2016 Document Revised: 07/24/2016 Document Reviewed: 07/24/2016 Elsevier Patient Education  2020 [employer].

## 2019-02-19 NOTE — Progress Notes (Signed)
10/22/20204:00 PM  Debbie Campos 1980/09/12, 38 y.o., female [id number]  Chief Complaint  Patient presents with  . Insomnia    x 2 days pt states she has not been getting any sleep.   . Stress    HPI:   Patient is a 38 y.o. female who presents today for insomnia  Having issues with anxiety causing insomnia, paresthesias in her BUE She denies it being a regular issues Most recent after death of her brother in law, became fearful, second guessing all her decisions, overwhelmed  She is also concerned for painful check spasms, massages TMJ, opens mouth H/o bell palsy on left side She also has h/o jaw dislocation, proper alignment was not restored  Calcium 9.29 June 2018 No h/o hypocalcemia on review  Depression screen [facility] 2/9 02/19/2019 11/10/2018 05/13/2018  Decreased Interest 2 0 0  Down, Depressed, Hopeless 2 0 0  PHQ - 2 Score 4 0 0  Altered sleeping 3 - -  Tired, decreased energy 2 - -  Change in appetite 0 - -  Feeling bad or failure about yourself  1 - -  Trouble concentrating 0 - -  Moving slowly or fidgety/restless 0 - -  Suicidal thoughts 0 - -  PHQ-9 Score 10 - -    Fall Risk  02/19/2019 11/10/2018 05/13/2018 12/24/2016  Falls in the past year? 0 0 0 No  Number falls in past yr: 0 - - -  Injury with Fall? 0 - - -  Follow up - Falls evaluation completed - -     No Known Allergies  Prior to Admission medications   Medication Sig Start Date End Date Taking? Authorizing Provider  Ascorbic Acid (VITAMIN C PO) Take by mouth daily.   Yes [provider]  Multiple Vitamins-Minerals (HAIR SKIN AND NAILS FORMULA) TABS Take by mouth.   Yes [provider]    Past Medical History:  Diagnosis Date  . Bell's palsy 2011   Told it was due to stress, spontaneously resolved  . Environmental allergies   . Infertility, female     Past Surgical History:  Procedure Laterality Date  . none      Social History   Tobacco Use  . Smoking  status: Never Smoker  . Smokeless tobacco: Never Used  Substance Use Topics  . Alcohol use: No    Family History  Problem Relation Age of Onset  . Diabetes Paternal Grandmother     ROS Per hpi  OBJECTIVE:  Today's Vitals   02/19/19 1558  BP: 121/72  Pulse: 94  Resp: 16  Temp: 98 F (36.7 C)  TempSrc: Oral  SpO2: 96%  Weight: 139 lb (63 kg)  Height: 5' 5.55" (1.665 m)   Body mass index is 22.74 kg/m.   Physical Exam Vitals signs and nursing note reviewed.  Constitutional:      Appearance: She is well-developed.  HENT:     Head: Normocephalic and atraumatic.     Jaw: Tenderness (TMJ) and pain on movement present. No trismus.  Eyes:     General: No scleral icterus.    Conjunctiva/sclera: Conjunctivae normal.     Pupils: Pupils are equal, round, and reactive to light.  Neck:     Musculoskeletal: Neck supple.  Pulmonary:     Effort: Pulmonary effort is normal.  Skin:    General: Skin is warm and dry.  Neurological:     Mental Status: She is alert and oriented to person, place, and time.  No results found for this or any previous visit (from the past 24 hour(s)).  No results found.   ASSESSMENT and PLAN  1. Psychophysiological insomnia Discussed supportive measures. Trial of vistaril prn, reviewed r/se/b  2. TMJ dysfunction Discussed supportive measures. Trial of flexeril, reviewed r/se/b  Other orders - hydrOXYzine (ATARAX/VISTARIL) 25 MG tablet; Take 1-2 tablets (25-50 mg total) by mouth at bedtime as needed for itching. - cyclobenzaprine (FLEXERIL) 10 MG tablet; Take 1 tablet (10 mg total) by mouth 3 (three) times daily as needed for muscle spasms.  Return in about 3 months (around 05/22/2019).    Rutherford Guys, MD [facility], [place] [address] Ph.  [phone] Fax [phone]

## 2019-05-26 ENCOUNTER — Other Ambulatory Visit: Payer: Self-pay

## 2019-05-26 ENCOUNTER — Ambulatory Visit (INDEPENDENT_AMBULATORY_CARE_PROVIDER_SITE_OTHER): Payer: Self-pay | Admitting: Emergency Medicine

## 2019-05-26 ENCOUNTER — Telehealth: Payer: Self-pay | Admitting: Emergency Medicine

## 2019-05-26 ENCOUNTER — Encounter: Payer: Self-pay | Admitting: Emergency Medicine

## 2019-05-26 VITALS — BP 127/71 | HR 88 | Temp 98.3°F | Wt 138.6 lb

## 2019-05-26 DIAGNOSIS — R21 Rash and other nonspecific skin eruption: Secondary | ICD-10-CM

## 2019-05-26 DIAGNOSIS — L989 Disorder of the skin and subcutaneous tissue, unspecified: Secondary | ICD-10-CM

## 2019-05-26 NOTE — Progress Notes (Signed)
Amanee Vazquez-Hernandez 39 y.o.   Chief Complaint  Patient presents with  . Back Pain    upper left back pain for 3 weeks. Pain in left arm was after flu shot 03/04/19 but little better now    HISTORY OF PRESENT ILLNESS: This is a 39 y.o. female complaining of itchy rash to left scapular area for the past several weeks with occasional discomfort.  Better today.  Also complaining of chronic facial rash for the past 2 months. Also received flu vaccine on November 4 and left shoulder hurt for 2 months after diet.  Better now.  HPI   Prior to Admission medications   Not on File    No Known Allergies  There are no problems to display for this patient.   Past Medical History:  Diagnosis Date  . Bell's palsy 2011   Told it was due to stress, spontaneously resolved  . Environmental allergies   . Infertility, female     Past Surgical History:  Procedure Laterality Date  . none      Social History   Socioeconomic History  . Marital status: Married    Spouse name: Not on file  . Number of children: Not on file  . Years of education: Not on file  . Highest education level: Not on file  Occupational History  . Not on file  Tobacco Use  . Smoking status: Never Smoker  . Smokeless tobacco: Never Used  Substance and Sexual Activity  . Alcohol use: No  . Drug use: No  . Sexual activity: Yes    Partners: Male    Birth control/protection: None    Comment: 1ST INTERCOURSE- 21, PARTNERS- 2, CURRENT PARTNER- 6 YRS   Other Topics Concern  . Not on file  Social History Narrative   Works part time   [place] about [place] from a friend   Chief Technology Officer language is spanish    Social Determinants of Radio broadcast assistant Strain:   . Difficulty of Paying Living Expenses: Not on file  Food Insecurity:   . Worried About Charity fundraiser in the Last Year: Not on file  . Ran Out of Food in the Last Year: Not on file  Transportation Needs:   . Lack of Transportation (Medical): Not  on file  . Lack of Transportation (Non-Medical): Not on file  Physical Activity:   . Days of Exercise per Week: Not on file  . Minutes of Exercise per Session: Not on file  Stress:   . Feeling of Stress : Not on file  Social Connections:   . Frequency of Communication with Friends and Family: Not on file  . Frequency of Social Gatherings with Friends and Family: Not on file  . Attends Religious Services: Not on file  . Active Member of Clubs or Organizations: Not on file  . Attends Archivist Meetings: Not on file  . Marital Status: Not on file  Intimate Partner Violence:   . Fear of Current or Ex-Partner: Not on file  . Emotionally Abused: Not on file  . Physically Abused: Not on file  . Sexually Abused: Not on file    Family History  Problem Relation Age of Onset  . Diabetes Paternal Grandmother      Review of Systems  Constitutional: Negative.  Negative for chills and fever.  HENT: Negative.  Negative for congestion and sore throat.   Respiratory: Negative.  Negative for cough and shortness of breath.   Cardiovascular: Negative.  Negative for chest pain and palpitations.  Gastrointestinal: Negative.  Negative for abdominal pain, diarrhea, nausea and vomiting.  Genitourinary: Negative.  Negative for dysuria and hematuria.  Musculoskeletal: Negative.  Negative for myalgias and neck pain.  Skin: Positive for itching and rash.  Neurological: Negative for dizziness and headaches.  All other systems reviewed and are negative.    Physical Exam Vitals reviewed.  Constitutional:      Appearance: Normal appearance.  HENT:     Head: Normocephalic.  Eyes:     Extraocular Movements: Extraocular movements intact.     Pupils: Pupils are equal, round, and reactive to light.  Cardiovascular:     Rate and Rhythm: Normal rate and regular rhythm.     Pulses: Normal pulses.     Heart sounds: Normal heart sounds.  Pulmonary:     Effort: Pulmonary effort is normal.      Breath sounds: Normal breath sounds.  Musculoskeletal:        General: Normal range of motion.     Cervical back: Normal range of motion and neck supple.  Skin:    General: Skin is warm and dry.     Capillary Refill: Capillary refill takes less than 2 seconds.     Findings: Rash present.     Comments: Positive facial rash Skin lesions to mid back  Neurological:     General: No focal deficit present.     Mental Status: She is alert and oriented to person, place, and time.          ASSESSMENT & PLAN: Marquita was seen today for back pain.  Diagnoses and all orders for this visit:  Rash and nonspecific skin eruption -     Ambulatory referral to Dermatology  Skin lesion -     Ambulatory referral to Dermatology    Patient Instructions       If you have lab work done today you will be contacted with your lab results within the next 2 weeks.  If you have not heard from [place] then please contact us. The fastest way to get your results is to register for My Chart.   IF you received an x-ray today, you will receive an invoice from [facility]. Please contact [facility] at [phone] with questions or concerns regarding your invoice.   IF you received labwork today, you will receive an invoice from [facility]. Please contact [facility] at [phone] with questions or concerns regarding your invoice.   Our billing staff will not be able to assist you with questions regarding bills from these companies.  You will be contacted with the lab results as soon as they are available. The fastest way to get your results is to activate your My Chart account. Instructions are located on the last page of this paperwork. If you have not heard from [place] regarding the results in 2 weeks, please contact this office.      Erupcin cutnea en los adultos Rash, Adult  Una erupcin es un cambio en el color de la piel. Una erupcin tambin puede cambiar la forma en que se siente  la piel. Hay muchas afecciones y [employer] que pueden causar una erupcin. Siga estas indicaciones en su casa: El objetivo del tratamiento es calmar la picazn y evitar que la erupcin se propague. Controle si hay algn cambio en sus sntomas. Informe a su mdico acerca de los cambios. Estas indicaciones pueden ayudarlo con la afeccin: [place] los medicamentos de [place] y  los recetados solamente como se lo haya indicado el mdico. Esto puede incluir medicamentos:  Para tratar la piel enrojecida o hinchada (cremas con corticoesteroides).  Para tratar la picazn.  Para tratar Obie Dredge (antihistamnicos por va oral).  Para tratar sntomas muy graves (corticoesteroides por va oral).  Cuidado de la piel  Coloque paos fros (compresas) en las zonas afectadas.  No se rasque ni se refriegue la piel.  Evite cubrir la erupcin. Asegrese de que la erupcin est expuesta al aire todo lo posible. Control de la picazn y [place] calientes. Estos pueden empeorar la picazn. Ardelia Mems ducha fra puede Runner, broadcasting/film/video.  Trate de tomar un bao con lo siguiente: ? Sales de [place]. Puede conseguirlas en la tienda de [place] indicaciones del envase. ? Bicarbonato de sodio. Vierta un poco en la baera como se lo haya indicado el mdico. ? Avena coloidal. Puede conseguirla en la tienda de comestibles o la farmacia local. [place] indicaciones del envase.  Intente colocarse una pasta de bicarbonato de [employer]. Agregue agua al bicarbonato de sodio hasta que se forme una pasta.  Intente aplicarse una locin que [employer] picazn (locin de calamina).  Mantngase fresco y al resguardo del sol. La transpiracin y el calor pueden empeorar la picazn. Indicaciones generales   Descanse todo lo que sea necesario.  Beba suficiente lquido para Contractor pis (la orina) de color amarillo  plido.  Use ropa holgada.  Evite los detergentes y los jabones perfumados, y los perfumes. Utilice jabones, detergentes, perfumes y cosmticos suaves.  Evite todo lo que le cause erupcin cutnea. Lleve un diario como ayuda para registrar lo que le causa erupcin. Escriba los siguientes datos: ? Lo que come. ? Los cosmticos que [place]. ? Lo que bebe. ? La ropa que [place]. [place] alhajas.  Concurra a todas las visitas de seguimiento como se lo haya indicado el mdico. Esto es importante. Comunquese con un mdico si:  Transpira de noche.  Pierde peso.  Orina ms de lo normal.  Orina menos de lo normal u observa que la orina es de color ms oscuro que lo normal.  Se siente dbil.  Vomita.  Tiene un color amarillo en la piel o en las partes blancas del ojo (ictericia).  La piel: ? Hormiguea. ? Se adormece.  La erupcin cutnea: ? No desaparece despus de [employer]. ? Empeora.  Usted: ? Est ms sediento que lo habitual. ? Est ms cansado que lo habitual.  Tiene los siguientes sntomas: ? Sntomas nuevos. ? Dolor en el vientre (abdomen). ? Fiebre. ? Materia fecal lquida (diarrea). Solicite ayuda inmediatamente si:  Jaclynn Guarneri, y los sntomas empeoran repentinamente.  Empieza a sentirse desorientado (confundido).  Siente un dolor de cabeza intenso o tiene rigidez en el cuello.  Siente mucho dolor o rigidez en las articulaciones.  Tiene una crisis de movimientos que no puede controlar (convulsiones).  La erupcin cubre todo el cuerpo o la mayor parte de [place]. La erupcin puede o no ser dolorosa.  Tiene ampollas con las siguientes caractersticas: ? Se encuentran arriba de la erupcin. ? Se agrandan. ? Crecen juntas. ? Son dolorosas. ? Estn dentro de la nariz o la boca.  Tiene una erupcin cutnea con estas caractersticas: ? Tiene pequeas manchas moradas, como si fueran pinchazos, en todo el cuerpo. ? Tiene un "ojo de buey" o se parece a  un blanco  de tiro. ? Est enrojecida y le duele, le produce descamacin de la piel y no se relaciona con haber estado mucho tiempo bajo el sol. Resumen  Una erupcin es un cambio en el color de la piel. Una erupcin tambin puede cambiar la forma en que se siente la piel.  El objetivo del tratamiento es calmar la picazn y evitar que la erupcin se propague.  Tome o [employer] medicamentos de venta libre y los recetados solamente como se lo haya indicado el mdico.  Comunquese con un mdico de inmediato si tiene sntomas nuevos o si sus sntomas empeoran.  Concurra a todas las visitas de seguimiento como se lo haya indicado el mdico. Esto es importante. Esta informacin no tiene Marine scientist el consejo del mdico. Asegrese de hacerle al mdico cualquier pregunta que tenga. Document Revised: 12/19/2017 Document Reviewed: 12/19/2017 Elsevier Patient Education  2020 Elsevier Inc.      Agustina Caroli, MD [facility]

## 2019-05-26 NOTE — Patient Instructions (Addendum)
   If you have lab work done today you will be contacted with your lab results within the next 2 weeks.  If you have not heard from us then please contact us. The fastest way to get your results is to register for My Chart.   IF you received an x-ray today, you will receive an invoice from [facility]. Please contact  Radiology at [phone] with questions or concerns regarding your invoice.   IF you received labwork today, you will receive an invoice from [facility]. Please contact [facility] at [phone] with questions or concerns regarding your invoice.   Our billing staff will not be able to assist you with questions regarding bills from these companies.  You will be contacted with the lab results as soon as they are available. The fastest way to get your results is to activate your My Chart account. Instructions are located on the last page of this paperwork. If you have not heard from us regarding the results in 2 weeks, please contact this office.      Erupcin cutnea en los adultos Rash, Adult  Una erupcin es un cambio en el color de la piel. Una erupcin tambin puede cambiar la forma en que se siente la piel. Hay muchas afecciones y factores diferentes que pueden causar una erupcin. Siga estas indicaciones en su casa: El objetivo del tratamiento es calmar la picazn y evitar que la erupcin se propague. Controle si hay algn cambio en sus sntomas. Informe a su mdico acerca de los cambios. Estas indicaciones pueden ayudarlo con la afeccin: Medicamentos [place] o aplique los medicamentos de venta libre y los recetados solamente como se lo haya indicado el mdico. Esto puede incluir medicamentos:  Para tratar la piel enrojecida o hinchada (cremas con corticoesteroides).  Para tratar la picazn.  Para tratar una alergia (antihistamnicos por va oral).  Para tratar sntomas muy graves (corticoesteroides por va oral).  Cuidado de la piel  Coloque  paos fros (compresas) en las zonas afectadas.  No se rasque ni se refriegue la piel.  Evite cubrir la erupcin. Asegrese de que la erupcin est expuesta al aire todo lo posible. Control de la picazn y las molestias  Evite los baos o las duchas calientes. Estos pueden empeorar la picazn. Una ducha fra puede aliviar el malestar.  Trate de tomar un bao con lo siguiente: ? Sales de [place]. Puede conseguirlas en la tienda de comestibles o la farmacia local. Siga las indicaciones del envase. ? Bicarbonato de sodio. Vierta un poco en la baera como se lo haya indicado el mdico. ? Avena coloidal. Puede conseguirla en la tienda de comestibles o la farmacia local. Siga las indicaciones del envase.  Intente colocarse una pasta de bicarbonato de sodio sobre la piel. Agregue agua al bicarbonato de sodio hasta que se forme una pasta.  Intente aplicarse una locin que alivie la picazn (locin de calamina).  Mantngase fresco y al resguardo del sol. La transpiracin y el calor pueden empeorar la picazn. Indicaciones generales   Descanse todo lo que sea necesario.  Beba suficiente lquido para mantener el pis (la orina) de color amarillo plido.  Use ropa holgada.  Evite los detergentes y los jabones perfumados, y los perfumes. Utilice jabones, detergentes, perfumes y cosmticos suaves.  Evite todo lo que le cause erupcin cutnea. Lleve un diario como ayuda para registrar lo que le causa erupcin. Escriba los siguientes datos: ? Lo que come. ? Los cosmticos que utiliza. ? Lo que bebe. ?   La ropa que usa. Esto incluye las alhajas.  Concurra a todas las visitas de seguimiento como se lo haya indicado el mdico. Esto es importante. Comunquese con un mdico si:  Transpira de noche.  Pierde peso.  Orina ms de lo normal.  Orina menos de lo normal u observa que la orina es de color ms oscuro que lo normal.  Se siente dbil.  Vomita.  Tiene un color amarillo en la piel o en  las partes blancas del ojo (ictericia).  La piel: ? Hormiguea. ? Se adormece.  La erupcin cutnea: ? No desaparece despus de unos das. ? Empeora.  Usted: ? Est ms sediento que lo habitual. ? Est ms cansado que lo habitual.  Tiene los siguientes sntomas: ? Sntomas nuevos. ? Dolor en el vientre (abdomen). ? Fiebre. ? Materia fecal lquida (diarrea). Solicite ayuda inmediatamente si:  Tiene fiebre, y los sntomas empeoran repentinamente.  Empieza a sentirse desorientado (confundido).  Siente un dolor de cabeza intenso o tiene rigidez en el cuello.  Siente mucho dolor o rigidez en las articulaciones.  Tiene una crisis de movimientos que no puede controlar (convulsiones).  La erupcin cubre todo el cuerpo o la mayor parte de este. La erupcin puede o no ser dolorosa.  Tiene ampollas con las siguientes caractersticas: ? Se encuentran arriba de la erupcin. ? Se agrandan. ? Crecen juntas. ? Son dolorosas. ? Estn dentro de la nariz o la boca.  Tiene una erupcin cutnea con estas caractersticas: ? Tiene pequeas manchas moradas, como si fueran pinchazos, en todo el cuerpo. ? Tiene un "ojo de buey" o se parece a un blanco de tiro. ? Est enrojecida y le duele, le produce descamacin de la piel y no se relaciona con haber estado mucho tiempo bajo el sol. Resumen  Una erupcin es un cambio en el color de la piel. Una erupcin tambin puede cambiar la forma en que se siente la piel.  El objetivo del tratamiento es calmar la picazn y evitar que la erupcin se propague.  Tome o aplique los medicamentos de venta libre y los recetados solamente como se lo haya indicado el mdico.  Comunquese con un mdico de inmediato si tiene sntomas nuevos o si sus sntomas empeoran.  Concurra a todas las visitas de seguimiento como se lo haya indicado el mdico. Esto es importante. Esta informacin no tiene como fin reemplazar el consejo del mdico. Asegrese de hacerle al  mdico cualquier pregunta que tenga. Document Revised: 12/19/2017 Document Reviewed: 12/19/2017 Elsevier Patient Education  2020 Elsevier Inc.  

## 2019-05-26 NOTE — Telephone Encounter (Signed)
Stat referral has been put in to get patient in asap with Dermatology Thank you

## 2019-05-26 NOTE — Telephone Encounter (Signed)
msg already been sent

## 2020-02-12 ENCOUNTER — Ambulatory Visit (INDEPENDENT_AMBULATORY_CARE_PROVIDER_SITE_OTHER): Payer: Self-pay

## 2020-02-12 ENCOUNTER — Encounter (HOSPITAL_COMMUNITY): Payer: Self-pay

## 2020-02-12 ENCOUNTER — Ambulatory Visit (HOSPITAL_COMMUNITY)
Admission: EM | Admit: 2020-02-12 | Discharge: 2020-02-12 | Disposition: A | Discharge status: Home / Self Care | Payer: Self-pay | Attending: Emergency Medicine | Admitting: Emergency Medicine

## 2020-02-12 ENCOUNTER — Other Ambulatory Visit: Payer: Self-pay

## 2020-02-12 DIAGNOSIS — M545 Low back pain, unspecified: Secondary | ICD-10-CM

## 2020-02-12 DIAGNOSIS — M549 Dorsalgia, unspecified: Secondary | ICD-10-CM

## 2020-02-12 DIAGNOSIS — Z3202 Encounter for pregnancy test, result negative: Secondary | ICD-10-CM

## 2020-02-12 LAB — POCT URINALYSIS DIPSTICK, ED / UC
Bilirubin Urine: NEGATIVE
Glucose, UA: NEGATIVE mg/dL
Ketones, ur: NEGATIVE mg/dL
Leukocytes,Ua: NEGATIVE
Nitrite: NEGATIVE
Protein, ur: NEGATIVE mg/dL
Specific Gravity, Urine: 1.025 (ref 1.005–1.030)
Urobilinogen, UA: 0.2 mg/dL (ref 0.0–1.0)
pH: 5 (ref 5.0–8.0)

## 2020-02-12 LAB — POC URINE PREG, ED: Preg Test, Ur: NEGATIVE

## 2020-02-12 MED ORDER — MELOXICAM 7.5 MG PO TABS: 15.0000 mg | ORAL_TABLET | Freq: Every day | ORAL | 1 refills | Status: AC

## 2020-02-12 MED ORDER — METHOCARBAMOL 500 MG PO TABS: 500.0000 mg | ORAL_TABLET | Freq: Three times a day (TID) | ORAL | 0 refills | Status: AC | PRN

## 2020-02-12 NOTE — ED Provider Notes (Signed)
Emergency Department Provider Note  ____________________________________________  Time seen: Approximately 9:05 AM  I have reviewed the triage vital signs and the nursing notes.   HISTORY  Chief Complaint Back Pain   Historian Patient     HPI Debbie Campos is a 39 y.o. female presents to the emergency department with right-sided low back pain for the past 2 weeks. Patient states that she experiences pressure along the right buttocks but denies radiation of pain into the right lower extremity. She denies bowel or bladder incontinence or saddle anesthesia. No fever or chills at home. She denies dysuria, hematuria or increased urinary frequency. She denies possibility of pregnancy. She states that she has experienced this pain in the past and it is worsened with prolonged movement and activities that she engages in at her place of work. No other alleviating measures have been attempted.   Past Medical History:  Diagnosis Date  . Bell's palsy 2011   Told it was due to stress, spontaneously resolved  . Environmental allergies   . Infertility, female      Immunizations up to date:  Yes.     Past Medical History:  Diagnosis Date  . Bell's palsy 2011   Told it was due to stress, spontaneously resolved  . Environmental allergies   . Infertility, female     Patient Active Problem List   Diagnosis Date Noted  . Skin lesion 05/26/2019    Past Surgical History:  Procedure Laterality Date  . none      Prior to Admission medications   Medication Sig Start Date End Date Taking? Authorizing Provider  meloxicam (MOBIC) 7.5 MG tablet Take 2 tablets (15 mg total) by mouth daily for 7 days. 02/12/20 02/19/20  Lannie Fields, PA-C  methocarbamol (ROBAXIN) 500 MG tablet Take 1 tablet (500 mg total) by mouth every 8 (eight) hours as needed for up to 5 days for muscle spasms. 02/12/20 02/17/20  Lannie Fields, PA-C    Allergies Patient has no known  allergies.  Family History  Problem Relation Age of Onset  . Diabetes Paternal Grandmother     Social History Social History   Tobacco Use  . Smoking status: Never Smoker  . Smokeless tobacco: Never Used  Vaping Use  . Vaping Use: Never used  Substance Use Topics  . Alcohol use: No  . Drug use: No     Review of Systems  Constitutional: No fever/chills Eyes:  No discharge ENT: No upper respiratory complaints. Respiratory: no cough. No SOB/ use of accessory muscles to breath Gastrointestinal:   No nausea, no vomiting.  No diarrhea.  No constipation. Musculoskeletal: Patient has low back pain.  Skin: Negative for rash, abrasions, lacerations, ecchymosis.    ____________________________________________   PHYSICAL EXAM:  VITAL SIGNS: ED Triage Vitals  Enc Vitals Group     BP 02/12/20 0830 131/80     Pulse Rate 02/12/20 0830 85     Resp 02/12/20 0830 18     Temp 02/12/20 0830 98.4 F (36.9 C)     Temp Source 02/12/20 0830 Oral     SpO2 02/12/20 0830 96 %     Weight --      Height --      Head Circumference --      Peak Flow --      Pain Score 02/12/20 0828 6     Pain Loc --      Pain Edu? --      Excl. in [place]? --  Constitutional: Alert and oriented. Well appearing and in no acute distress. Eyes: Conjunctivae are normal. PERRL. EOMI. Head: Atraumatic. Cardiovascular: Normal rate, regular rhythm. Normal S1 and S2.  Good peripheral circulation. Respiratory: Normal respiratory effort without tachypnea or retractions. Lungs CTAB. Good air entry to the bases with no decreased or absent breath sounds Gastrointestinal: Bowel sounds x 4 quadrants. Soft and nontender to palpation. No guarding or rigidity. No distention. Musculoskeletal: Full range of motion to all extremities. No obvious deformities noted. Patient has some paraspinal muscle tenderness to palpation on the right. Negative straight leg raise test, right.  Neurologic:  Normal for age. No gross focal  neurologic deficits are appreciated.  Skin:  Skin is warm, dry and intact. No rash noted. Psychiatric: Mood and affect are normal for age. Speech and behavior are normal.   ____________________________________________   LABS (all labs ordered are listed, but only abnormal results are displayed)  Labs Reviewed  POCT URINALYSIS DIPSTICK, ED / UC - Abnormal; Notable for the following components:      Result Value   Hgb urine dipstick SMALL (*)    All other components within normal limits  POC URINE PREG, ED   ____________________________________________  EKG   ____________________________________________  RADIOLOGY   DG Lumbar Spine 2-3 Views  Result Date: 02/12/2020 CLINICAL DATA:  Pain following recent fall EXAM: LUMBAR SPINE - 2-3 VIEW COMPARISON:  None. FINDINGS: Frontal, lateral, and spot lumbosacral lateral images were obtained. There are 5 non-rib-bearing lumbar type vertebral bodies. The T12 ribs are markedly hypoplastic. There is no fracture or spondylolisthesis. The disc spaces appear unremarkable. No erosive change. IMPRESSION: No fracture or spondylolisthesis.  No appreciable arthropathy. Electronically Signed   By: Lowella Grip III M.D.   On: 02/12/2020 10:07    ____________________________________________    PROCEDURES  Procedure(s) performed:     Procedures     Medications - No data to display   ____________________________________________   INITIAL IMPRESSION / ASSESSMENT AND PLAN / ED COURSE  Pertinent labs & imaging results that were available during my care of the patient were reviewed by me and considered in my medical decision making (see chart for details).      Assessment and Plan:  Low back pain:  39 year old female presents to the emergency department with right-sided low back pain for the past 2 weeks.  Vital signs were reassuring in triage. On physical exam, patient was alert, active and nontoxic-appearing. She can move  easily from a sitting position to a standing position. She had negative straight leg raise test bilaterally. X-ray of the lumbar spine revealed no acute bony abnormality. Urinalysis did not suggest UTI. Urine pregnancy testing was negative.  Patient was discharged with meloxicam and Robaxin. Return precautions were given to return with new or worsening symptoms.   ____________________________________________  FINAL CLINICAL IMPRESSION(S) / ED DIAGNOSES  Final diagnoses:  Acute right-sided low back pain without sciatica      NEW MEDICATIONS STARTED DURING THIS VISIT:  ED Discharge Orders         Ordered    meloxicam (MOBIC) 7.5 MG tablet  Daily        02/12/20 1018    methocarbamol (ROBAXIN) 500 MG tablet  Every 8 hours PRN        02/12/20 1018              This chart was dictated using voice recognition software/Dragon. Despite best efforts to proofread, errors can occur which can change the meaning. Any change was  purely unintentional.     Lannie Fields, PA-C 02/12/20 1129

## 2020-02-12 NOTE — ED Triage Notes (Signed)
Pt presents with right-sided lower back pain x 2 weeks. States having pressure in the right side buttocks. Denies numbness or tingling in the legs. Pt not taking any meds for the complaint.

## 2020-02-12 NOTE — Discharge Instructions (Signed)
Take Meloxicam daily for pain and inflammation. You can take Robaxin, a muscle relaxer at night.

## 2020-06-20 ENCOUNTER — Ambulatory Visit: Payer: Self-pay | Admitting: Obstetrics & Gynecology

## 2020-06-24 ENCOUNTER — Encounter: Payer: Self-pay | Admitting: Obstetrics & Gynecology

## 2020-06-24 ENCOUNTER — Ambulatory Visit (INDEPENDENT_AMBULATORY_CARE_PROVIDER_SITE_OTHER): Payer: Self-pay | Admitting: Obstetrics & Gynecology

## 2020-06-24 ENCOUNTER — Other Ambulatory Visit: Payer: Self-pay

## 2020-06-24 VITALS — BP 120/78 | Ht 65.0 in | Wt 139.0 lb

## 2020-06-24 DIAGNOSIS — N979 Female infertility, unspecified: Secondary | ICD-10-CM

## 2020-06-24 DIAGNOSIS — Z01419 Encounter for gynecological examination (general) (routine) without abnormal findings: Secondary | ICD-10-CM

## 2020-06-24 NOTE — Progress Notes (Signed)
    Debbie Campos Oct 27, 1980 [id number]   History:    40 y.o. G0 Married  RP:  Established patient presenting for annual gyn exam   HPI: Menstrual periods every month with normal flow.  Mild dysmenorrhea.  No breakthrough bleeding.  No pelvic pain.  Feels dry vaginally with intercourse after her menstrual, not at other times.  History of primary infertility, tried Clomid stimulation with IUI's before.  Husband does not want to further investigate or treat.  Breast normal.  Good body mass index at 23.13.  Good fitness.  Healthy nutrition.  Fasting Health labs normal in 06/2018.   Past medical history,surgical history, family history and social history were all reviewed and documented in the EPIC chart.  Gynecologic History Patient's last menstrual period was 06/24/2020.  Obstetric History OB History  Gravida Para Term Preterm AB Living  0            SAB IAB Ectopic Multiple Live Births                ROS: A ROS was performed and pertinent positives and negatives are included in the history.  GENERAL: No fevers or chills. HEENT: No change in vision, no earache, sore throat or sinus congestion. NECK: No pain or stiffness. CARDIOVASCULAR: No chest pain or pressure. No palpitations. PULMONARY: No shortness of breath, cough or wheeze. GASTROINTESTINAL: No abdominal pain, nausea, vomiting or diarrhea, melena or bright red blood per rectum. GENITOURINARY: No urinary frequency, urgency, hesitancy or dysuria. MUSCULOSKELETAL: No joint or muscle pain, no back pain, no recent trauma. DERMATOLOGIC: No rash, no itching, no lesions. ENDOCRINE: No polyuria, polydipsia, no heat or cold intolerance. No recent change in weight. HEMATOLOGICAL: No anemia or easy bruising or bleeding. NEUROLOGIC: No headache, seizures, numbness, tingling or weakness. PSYCHIATRIC: No depression, no loss of interest in normal activity or change in sleep pattern.     Exam:   BP 120/78   Ht 5\' 5"  (1.651 m)   Wt  139 lb (63 kg)   LMP 06/24/2020   BMI 23.13 kg/m   Body mass index is 23.13 kg/m.  General appearance : Well developed well nourished female. No acute distress HEENT: Eyes: no retinal hemorrhage or exudates,  Neck supple, trachea midline, no carotid bruits, no thyroidmegaly Lungs: Clear to auscultation, no rhonchi or wheezes, or rib retractions  Heart: Regular rate and rhythm, no murmurs or gallops Breast:Examined in sitting and supine position were symmetrical in appearance, no palpable masses or tenderness,  no skin retraction, no nipple inversion, no nipple discharge, no skin discoloration, no axillary or supraclavicular lymphadenopathy Abdomen: no palpable masses or tenderness, no rebound or guarding Extremities: no edema or skin discoloration or tenderness  Pelvic: Vulva: Normal             Vagina: No gross lesions or discharge  Cervix: No gross lesions or discharge.  Pap reflex done.  Uterus  AV, normal size, shape and consistency, non-tender and mobile  Adnexa  Without masses or tenderness  Anus: Normal   Assessment/Plan:  40 y.o. female for annual exam   1. Encounter for routine gynecological examination with Papanicolaou smear of cervix Normal gynecologic exam.  Pap reflex done.  Breast exam normal.  Strongly recommend to schedule a screening mammo.  Fasting health labs normal 06/2018.  Good BMI at 20.13.  Continue with fitness and healthy nutrition.  2. Primary female infertility Declines investigation/treatment.  Declines contraception.  Princess Bruins MD, 11:44 AM 06/24/2020

## 2020-06-24 NOTE — Addendum Note (Signed)
Addended by: Thurnell Garbe A on: 06/24/2020 02:55 PM   Modules accepted: Orders

## 2020-06-28 LAB — PAP IG W/ RFLX HPV ASCU

## 2020-07-06 ENCOUNTER — Other Ambulatory Visit: Payer: Self-pay | Admitting: Obstetrics & Gynecology

## 2020-07-07 ENCOUNTER — Other Ambulatory Visit: Payer: Self-pay | Admitting: Obstetrics & Gynecology

## 2020-07-07 DIAGNOSIS — Z1231 Encounter for screening mammogram for malignant neoplasm of breast: Secondary | ICD-10-CM

## 2020-08-31 ENCOUNTER — Other Ambulatory Visit: Payer: Self-pay

## 2020-08-31 ENCOUNTER — Ambulatory Visit
Admission: RE | Admit: 2020-08-31 | Discharge: 2020-08-31 | Disposition: A | Discharge status: Home / Self Care | Payer: No Typology Code available for payment source | Source: Ambulatory Visit | Attending: Obstetrics & Gynecology | Admitting: Obstetrics & Gynecology

## 2020-08-31 DIAGNOSIS — Z1231 Encounter for screening mammogram for malignant neoplasm of breast: Secondary | ICD-10-CM

## 2020-09-01 ENCOUNTER — Other Ambulatory Visit: Payer: Self-pay | Admitting: Obstetrics & Gynecology

## 2020-09-01 DIAGNOSIS — R928 Other abnormal and inconclusive findings on diagnostic imaging of breast: Secondary | ICD-10-CM

## 2020-09-21 ENCOUNTER — Other Ambulatory Visit: Payer: Self-pay

## 2020-09-21 ENCOUNTER — Ambulatory Visit
Admission: RE | Admit: 2020-09-21 | Discharge: 2020-09-21 | Disposition: A | Discharge status: Home / Self Care | Payer: No Typology Code available for payment source | Source: Ambulatory Visit | Attending: Obstetrics & Gynecology | Admitting: Obstetrics & Gynecology

## 2020-09-21 DIAGNOSIS — R928 Other abnormal and inconclusive findings on diagnostic imaging of breast: Secondary | ICD-10-CM

## 2020-10-25 ENCOUNTER — Ambulatory Visit (INDEPENDENT_AMBULATORY_CARE_PROVIDER_SITE_OTHER): Payer: Self-pay | Admitting: Obstetrics & Gynecology

## 2020-10-25 ENCOUNTER — Other Ambulatory Visit: Payer: Self-pay

## 2020-10-25 ENCOUNTER — Encounter: Payer: Self-pay | Admitting: Obstetrics & Gynecology

## 2020-10-25 VITALS — BP 100/64 | HR 79 | Resp 16

## 2020-10-25 DIAGNOSIS — N898 Other specified noninflammatory disorders of vagina: Secondary | ICD-10-CM

## 2020-10-25 DIAGNOSIS — Z113 Encounter for screening for infections with a predominantly sexual mode of transmission: Secondary | ICD-10-CM

## 2020-10-25 LAB — WET PREP FOR TRICH, YEAST, CLUE

## 2020-10-25 MED ORDER — TINIDAZOLE 500 MG PO TABS: 1000.0000 mg | ORAL_TABLET | Freq: Two times a day (BID) | ORAL | 0 refills | Status: AC

## 2020-10-25 NOTE — Progress Notes (Signed)
    Samatha Anspach 1980/05/05 [id number]        40 y.o.  G0 Married  RP: Vaginal discharge with odor x 1 week  HPI: Vaginal discharge with odor x 1 week.  No pelvic pain.  No fever.  H/O Infertility.  Normal menses.     OB History  Gravida Para Term Preterm AB Living  0            SAB IAB Ectopic Multiple Live Births               Past medical history,surgical history, problem list, medications, allergies, family history and social history were all reviewed and documented in the EPIC chart.   Directed ROS with pertinent positives and negatives documented in the history of present illness/assessment and plan.  Exam:  Vitals:   10/25/20 1604  BP: 100/64  Pulse: 79  Resp: 16   General appearance:  Normal  Abdomen: Normal  Gynecologic exam: Vulva normal.  Speculum:  Cervix mildly inflamed and bleeding to touch.  Gono-Chlam done.  Increased discharge with bubbles.  Wet prep done.  Wet prep:  Clue cells present with odor   Assessment/Plan:  40 y.o. G0P0   1. Vaginal discharge Bacterial vaginosis confirmed by wet prep.  Will treat with Tinidazole 2 tab PO BID x 2 days.  Usage reviewed and prescription sent to pharmacy. - WET PREP FOR [place], YEAST, CLUE  2. Screening for STDs (sexually transmitted diseases) - C. trachomatis/N. gonorrhoeae RNA   Princess Bruins MD, 4:15 PM 10/25/2020

## 2020-10-26 LAB — C. TRACHOMATIS/N. GONORRHOEAE RNA
C. trachomatis RNA, TMA: NOT DETECTED
N. gonorrhoeae RNA, TMA: NOT DETECTED

## 2020-11-02 ENCOUNTER — Other Ambulatory Visit: Payer: Self-pay

## 2020-11-02 ENCOUNTER — Encounter (HOSPITAL_COMMUNITY): Payer: Self-pay

## 2020-11-02 ENCOUNTER — Ambulatory Visit (HOSPITAL_COMMUNITY)
Admission: EM | Admit: 2020-11-02 | Discharge: 2020-11-02 | Disposition: A | Discharge status: Home / Self Care | Payer: No Typology Code available for payment source | Attending: Medical Oncology | Admitting: Medical Oncology

## 2020-11-02 DIAGNOSIS — M79672 Pain in left foot: Secondary | ICD-10-CM

## 2020-11-02 DIAGNOSIS — M79671 Pain in right foot: Secondary | ICD-10-CM

## 2020-11-02 MED ORDER — MELOXICAM 7.5 MG PO TABS: 7.5000 mg | ORAL_TABLET | Freq: Every day | ORAL | 0 refills | Status: DC

## 2020-11-02 NOTE — Discharge Instructions (Addendum)
Fleet Feet for insoles and new shoes

## 2020-11-02 NOTE — ED Provider Notes (Signed)
[facility]    CSN: [id number] Arrival date & time: 11/02/20  1455      History   Chief Complaint Chief Complaint  Patient presents with   Ankle Pain   Foot Pain    HPI Debbie Campos is a 40 y.o. female. Juleen [place] [id number]  HPI  Ankle and Foot Pain: Pt reports with the assistance of a medical interpretor that she has been completing a fitness challenge. She is working out 2 hours per day. After her work outs she reports ankle and feet swelling and pain. Some numbness and tingling during the activity as well. No known specific injuries. She has tried nothing for symptoms.   Past Medical History:  Diagnosis Date   Bell's palsy 2011   Told it was due to stress, spontaneously resolved   Environmental allergies    Infertility, female     Patient Active Problem List   Diagnosis Date Noted   Skin lesion 05/26/2019    Past Surgical History:  Procedure Laterality Date   none      OB History     Gravida  0   Para      Term      Preterm      AB      Living         SAB      IAB      Ectopic      Multiple      Live Births               Home Medications    Prior to Admission medications   Not on File    Family History Family History  Problem Relation Age of Onset   Diabetes Paternal Grandmother     Social History Social History   Tobacco Use   Smoking status: Never   Smokeless tobacco: Never  Vaping Use   Vaping Use: Never used  Substance Use Topics   Alcohol use: No   Drug use: No     Allergies   Patient has no known allergies.   Review of Systems Review of Systems  As stated above in HPI Physical Exam Triage Vital Signs ED Triage Vitals  Enc Vitals Group     BP 11/02/20 1629 (!) 142/86     Pulse Rate 11/02/20 1629 98     Resp 11/02/20 1629 17     Temp 11/02/20 1629 99.3 F (37.4 C)     Temp Source 11/02/20 1629 Oral     SpO2 11/02/20 1629 98 %     Weight --      Height --       Head Circumference --      Peak Flow --      Pain Score 11/02/20 1627 5     Pain Loc --      Pain Edu? --      Excl. in [place]? --    No data found.  Updated Vital Signs BP (!) 142/86 (BP Location: Right Arm)   Pulse 98   Temp 99.3 F (37.4 C) (Oral)   Resp 17   LMP 10/11/2020 (Exact Date)   SpO2 98%   Physical Exam Vitals and nursing note reviewed.  Constitutional:      Appearance: Normal appearance.  Cardiovascular:     Pulses: Normal pulses.  Musculoskeletal:        General: No swelling, tenderness or deformity. Normal range of motion.  Skin:    General: Skin is  warm.     Coloration: Skin is not jaundiced or pale.     Findings: No bruising, erythema or rash.  Neurological:     General: No focal deficit present.     Mental Status: She is alert.     Cranial Nerves: No cranial nerve deficit.     Sensory: No sensory deficit.     Motor: No weakness.     Coordination: Coordination normal.     Gait: Gait normal.     Deep Tendon Reflexes: Reflexes normal.     UC Treatments / Results  Labs (all labs ordered are listed, but only abnormal results are displayed) Labs Reviewed - No data to display  EKG   Radiology No results found.  Procedures Procedures (including critical care time)  Medications Ordered in UC Medications - No data to display  Initial Impression / Assessment and Plan / UC Course  I have reviewed the triage vital signs and the nursing notes.  Pertinent labs & imaging results that were available during my care of the patient were reviewed by me and considered in my medical decision making (see chart for details).     New.  Patient brings in her sneakers that she wears for running.  She is currently walking with the sneakers and is walking on the heels of the sneakers.  The middle section of the sneakers is significantly wider than intended.  Her examination is normal.  I highly suggest that her pain is secondary to overwork as well as improper  footwear.  She appears to have a fairly significantly high arch and has no arch support and has shoes with no supportive backing and are wider in the center than ideal.  I have recommended that she use insoles and have new proper footwear that she wears appropriately which we discussed.  She can use Mobic as needed for some discomfort.  Final Clinical Impressions(s) / UC Diagnoses   Final diagnoses:  None   Discharge Instructions   None    ED Prescriptions   None    PDMP not reviewed this encounter.   Hughie Closs, [place] 11/02/20 1755

## 2020-11-02 NOTE — ED Triage Notes (Signed)
Pt presents with left ankle and foot swelling.   States she goes for walks and a run at the park and states her feet have been hurting and swelling after.   Pt states she feels tingling sensation under her foot.

## 2020-11-10 ENCOUNTER — Other Ambulatory Visit: Payer: Self-pay

## 2020-11-10 ENCOUNTER — Encounter: Payer: Self-pay | Admitting: Nurse Practitioner

## 2020-11-10 ENCOUNTER — Ambulatory Visit (INDEPENDENT_AMBULATORY_CARE_PROVIDER_SITE_OTHER): Payer: Self-pay | Admitting: Nurse Practitioner

## 2020-11-10 DIAGNOSIS — N898 Other specified noninflammatory disorders of vagina: Secondary | ICD-10-CM

## 2020-11-10 DIAGNOSIS — N760 Acute vaginitis: Secondary | ICD-10-CM

## 2020-11-10 LAB — WET PREP FOR TRICH, YEAST, CLUE

## 2020-11-10 NOTE — Progress Notes (Signed)
   Acute Office Visit  Subjective:    Patient ID: Debbie Campos, female    DOB: [birth year], 40 y.o.   MRN: [id number]   HPI 40 y.o. presents today for vaginal itching without discharge or odor. The itching is  mostly on the outside and around the vaginal opening. She was treated for BV 10/25/2020 with Tinidazole. Virtual/audio spanish interpreter used during visit.    Review of Systems  Constitutional: Negative.   Genitourinary:  Negative for vaginal discharge.       Vaginal itching      Objective:    Physical Exam Constitutional:      Appearance: Normal appearance.  Genitourinary:    Comments: Mild redness at vaginal opening   LMP 11/06/2020 (Exact Date)  Wt Readings from Last 3 Encounters:  06/24/20 139 lb (63 kg)  05/26/19 138 lb 9.6 oz (62.9 kg)  02/19/19 139 lb (63 kg)   Wet prep negative     Assessment & Plan:   Problem List Items Addressed This Visit   None Visit Diagnoses     Acute vaginitis    -  Primary   Vaginal itching       Relevant Orders   WET PREP FOR [place], YEAST, [place] (Completed)      Plan: Negative wet prep. Recommend using OTC hydrocortisone 1% ointment twice daily x 7 days. May continue to use Vagisil for symptoms. If symptoms worsen or do not improve she will return to office. She is agreeable to plan.      Tamela Gammon DNP, 4:48 PM 11/10/2020

## 2020-12-21 ENCOUNTER — Ambulatory Visit (INDEPENDENT_AMBULATORY_CARE_PROVIDER_SITE_OTHER): Payer: Self-pay | Admitting: Obstetrics & Gynecology

## 2020-12-21 ENCOUNTER — Other Ambulatory Visit: Payer: Self-pay

## 2020-12-21 ENCOUNTER — Encounter: Payer: Self-pay | Admitting: Obstetrics & Gynecology

## 2020-12-21 VITALS — BP 110/76

## 2020-12-21 DIAGNOSIS — N9089 Other specified noninflammatory disorders of vulva and perineum: Secondary | ICD-10-CM

## 2020-12-21 DIAGNOSIS — B372 Candidiasis of skin and nail: Secondary | ICD-10-CM

## 2020-12-21 DIAGNOSIS — N898 Other specified noninflammatory disorders of vagina: Secondary | ICD-10-CM

## 2020-12-21 LAB — WET PREP FOR TRICH, YEAST, CLUE

## 2020-12-21 MED ORDER — TINIDAZOLE 500 MG PO TABS: 1000.0000 mg | ORAL_TABLET | Freq: Two times a day (BID) | ORAL | Status: DC

## 2020-12-21 MED ORDER — NYSTATIN 100000 UNIT/GM EX POWD: 1.0000 "application " | Freq: Two times a day (BID) | CUTANEOUS | 3 refills | Status: DC

## 2020-12-21 MED ORDER — FLUCONAZOLE 150 MG PO TABS: 150.0000 mg | ORAL_TABLET | Freq: Once | ORAL | 3 refills | Status: AC

## 2020-12-21 NOTE — Progress Notes (Signed)
    Debbie Campos 07/27/1980 [id number]        40 y.o.  G0 Married  RP: Vaginal itching with vulvar irritation  HPI: Recurrent vaginal itching and vulvar irritation, worst prior to menses.  Irritation with penetration.  Gono-Chlam neg 10/25/2020.  Declines repeat STI screen.   OB History  Gravida Para Term Preterm AB Living  0            SAB IAB Ectopic Multiple Live Births               Past medical history,surgical history, problem list, medications, allergies, family history and social history were all reviewed and documented in the EPIC chart.   Directed ROS with pertinent positives and negatives documented in the history of present illness/assessment and plan.  Exam:  Vitals:   12/21/20 1528  BP: 110/76   General appearance:  Normal  Abdomen: Normal  Gynecologic exam: Vulva with mild erythema.  No localized lesion.  Speculum:  Cervix/Vagina normal.  Increased discharge.  Wet prep done.  Wet prep: Clue cells present.  Odor present   Assessment/Plan:  40 y.o. G0P0   1. Vaginal itching Confirmed bacterial vaginosis on wet prep.  Second episode since June 2022.  We will treat again with tinidazole.  Usage reviewed and prescription sent to pharmacy.  Patient recommended to use boric acid weekly for prevention. - WET PREP FOR [place], YEAST, CLUE  2. Vulvar irritation Probably d/t vaginal discharge of BV causing irritation.  May use CS ointment to decrease inflammation. - WET PREP FOR [place], YEAST, CLUE  3. Yeast infection of the skin Will treat with Nystatin powder.  Usage reviewed and prescription sent to pharmacy.  Other orders - Benzocaine-Resorcinol (VAGISIL EX); Apply topically. - nystatin (MYCOSTATIN/NYSTOP) powder; Apply 1 application topically 2 (two) times daily. - tinidazole (TINDAMAX) 500 MG tablet; Take 2 tablets (1,000 mg total) by mouth 2 (two) times daily for 2 days. - fluconazole (DIFLUCAN) 150 MG tablet; Take 1 tablet (150 mg total) by  mouth once for 1 dose.   Princess Bruins MD, 3:42 PM 12/21/2020

## 2020-12-22 ENCOUNTER — Telehealth: Payer: Self-pay

## 2020-12-22 ENCOUNTER — Other Ambulatory Visit: Payer: Self-pay

## 2020-12-22 ENCOUNTER — Encounter: Payer: Self-pay | Admitting: Obstetrics & Gynecology

## 2020-12-22 MED ORDER — TINIDAZOLE 500 MG PO TABS: 1000.0000 mg | ORAL_TABLET | Freq: Two times a day (BID) | ORAL | 0 refills | Status: AC

## 2020-12-22 MED ORDER — TINIDAZOLE 500 MG PO TABS: 1000.0000 mg | ORAL_TABLET | Freq: Two times a day (BID) | ORAL | Status: DC

## 2020-12-22 NOTE — Telephone Encounter (Signed)
Rx for tindamax printed instead of going to pharmacy. I called & they stated they didn't have rx. On hold for 10 minutes waiting to speak to pharmacist to give verbal order. Rx resent to pharmacy to [facility]. Tindamax '500mg'$  Take 2 tablets twice daily for 2 days #8 with 0 refills

## 2021-01-26 ENCOUNTER — Telehealth: Payer: Self-pay | Admitting: *Deleted

## 2021-01-26 MED ORDER — TINIDAZOLE 500 MG PO TABS: ORAL_TABLET | ORAL | 1 refills | Status: DC

## 2021-01-26 NOTE — Telephone Encounter (Signed)
Patient called requesting a refill on tinidazole (TINDAMAX) 500 MG tablet; Take 2 tablets (1,000 mg total) by mouth 2 (two) times daily for 2 days. Rx was prescribed on 12/21/20, reports the Rx helped,but symptoms returned. She has refill on diflucan tablet already, she asked if Rx could be sent without OV? Please advise

## 2021-01-26 NOTE — Telephone Encounter (Signed)
Dr.Lavoie replied " Agree with 1 refill of Tinidazole, then will need a visit.     Patient informed.

## 2021-04-20 ENCOUNTER — Other Ambulatory Visit: Payer: Self-pay

## 2021-04-20 ENCOUNTER — Encounter (HOSPITAL_COMMUNITY): Payer: Self-pay

## 2021-04-20 ENCOUNTER — Ambulatory Visit (HOSPITAL_COMMUNITY)
Admission: EM | Admit: 2021-04-20 | Discharge: 2021-04-20 | Disposition: A | Discharge status: Home / Self Care | Payer: No Typology Code available for payment source | Attending: Student | Admitting: Student

## 2021-04-20 ENCOUNTER — Ambulatory Visit (INDEPENDENT_AMBULATORY_CARE_PROVIDER_SITE_OTHER): Payer: Self-pay

## 2021-04-20 DIAGNOSIS — R1319 Other dysphagia: Secondary | ICD-10-CM

## 2021-04-20 DIAGNOSIS — R079 Chest pain, unspecified: Secondary | ICD-10-CM

## 2021-04-20 MED ORDER — SUCRALFATE 1 GM/10ML PO SUSP: 1.0000 g | Freq: Three times a day (TID) | ORAL | 0 refills | Status: DC | PRN

## 2021-04-20 NOTE — ED Triage Notes (Signed)
Pt presents with c/o chest pain X 1 week.   States when she takes a breath in she feels her breathing makes a pause. Denies coughing. States she feels something Is stuck in her chest.

## 2021-04-20 NOTE — ED Provider Notes (Signed)
[place]    CSN: [id number] Arrival date & time: 04/20/21  0801      History   Chief Complaint Chief Complaint  Patient presents with   Chest Pain    MID    HPI Debbie Campos is a 40 y.o. female presenting with difficulty swallowing and exhaling.  Medical history noncontributory, denies history of cardiopulmonary disease. States that whenever she exhales she feels that her breathing stops in her chest. States whenever she swallows food it feels like something gets stuck and stops on the way. States whenever she eats it feels like the food gets stuck. Denies SOB, dizziness, CP at rest. Denies history GERD, denies abd pain. Denies recent URI, fevers/chills.   HPI  Past Medical History:  Diagnosis Date   Bell's palsy 2011   Told it was due to stress, spontaneously resolved   Environmental allergies    Infertility, female     Patient Active Problem List   Diagnosis Date Noted   Skin lesion 05/26/2019    Past Surgical History:  Procedure Laterality Date   none      OB History     Gravida  0   Para      Term      Preterm      AB      Living         SAB      IAB      Ectopic      Multiple      Live Births               Home Medications    Prior to Admission medications   Medication Sig Start Date End Date Taking? Authorizing Provider  sucralfate (CARAFATE) 1 GM/10ML suspension Take 10 mLs (1 g total) by mouth 3 (three) times daily as needed. 04/20/21  Yes Hazel Sams, PA-C  Benzocaine-Resorcinol (VAGISIL EX) Apply topically.    [provider]  fluconazole (DIFLUCAN) 150 MG tablet Take 150 mg by mouth once. 12/21/20   [provider]  nystatin (MYCOSTATIN/NYSTOP) powder Apply 1 application topically 2 (two) times daily. 12/21/20   Princess Bruins, MD  tinidazole (TINDAMAX) 500 MG tablet Take 2 tablets by mouth twice daily for 2 days. 01/26/21   Princess Bruins, MD    Family History Family  History  Problem Relation Age of Onset   Diabetes Paternal Grandmother     Social History Social History   Tobacco Use   Smoking status: Never   Smokeless tobacco: Never  Vaping Use   Vaping Use: Never used  Substance Use Topics   Alcohol use: No   Drug use: No     Allergies   Patient has no known allergies.   Review of Systems Review of Systems  HENT:  Positive for trouble swallowing.   All other systems reviewed and are negative.   Physical Exam Triage Vital Signs ED Triage Vitals  Enc Vitals Group     BP      Pulse      Resp      Temp      Temp src      SpO2      Weight      Height      Head Circumference      Peak Flow      Pain Score      Pain Loc      Pain Edu?      Excl. in [place]?  No data found.  Updated Vital Signs BP 126/82 (BP Location: Right Arm)    Pulse 82    Temp 98.6 F (37 C) (Oral)    Resp 17    LMP 04/16/2021 (Exact Date)    SpO2 98%   Visual Acuity Right Eye Distance:   Left Eye Distance:   Bilateral Distance:    Right Eye Near:   Left Eye Near:    Bilateral Near:     Physical Exam Vitals reviewed.  Constitutional:      General: She is not in acute distress.    Appearance: Normal appearance. She is not ill-appearing or diaphoretic.  HENT:     Head: Normocephalic and atraumatic.  Cardiovascular:     Rate and Rhythm: Normal rate and regular rhythm.     Heart sounds: Normal heart sounds.  Pulmonary:     Effort: Pulmonary effort is normal.     Breath sounds: Normal breath sounds.  Chest:     Chest wall: No tenderness.  Skin:    General: Skin is warm.  Neurological:     General: No focal deficit present.     Mental Status: She is alert and oriented to person, place, and time.  Psychiatric:        Mood and Affect: Mood normal.        Behavior: Behavior normal.        Thought Content: Thought content normal.        Judgment: Judgment normal.     UC Treatments / Results  Labs (all labs ordered are listed, but only  abnormal results are displayed) Labs Reviewed - No data to display  EKG   Radiology DG Chest 2 View  Result Date: 04/20/2021 CLINICAL DATA:  Central chest pain for the past week. EXAM: CHEST - 2 VIEW COMPARISON:  Chest x-ray dated October 30, 2010. FINDINGS: The heart size and mediastinal contours are within normal limits. Both lungs are clear. The visualized skeletal structures are unremarkable. IMPRESSION: No active cardiopulmonary disease. Electronically Signed   By: Titus Dubin M.D.   On: 04/20/2021 08:53    Procedures Procedures (including critical care time)  Medications Ordered in UC Medications - No data to display  Initial Impression / Assessment and Plan / UC Course  I have reviewed the triage vital signs and the nursing notes.  Pertinent labs & imaging results that were available during my care of the patient were reviewed by me and considered in my medical decision making (see chart for details).     This patient is a very pleasant 40 y.o. year old female presenting with sensation of food getting stuck in her throat with swallowing. Reassuring exam. Xray chest - No active cardiopulmonary disease. No obvious esophageal stricture.   Trial of sucralfate, f/u with PCP if symptoms persist, would consider ENT referral  ED return precautions discussed. Patient verbalizes understanding and agreement.   Spoke with this patient using language line.   Final Clinical Impressions(s) / UC Diagnoses   Final diagnoses:  Other dysphagia     Discharge Instructions      -Sucralfate up to 3x daily as needed -Follow-up with PCP for additional workup if symptoms persist.  -Head to ED if new symptoms like chest pain, palpitations, shortness of breath, etc.      ED Prescriptions     Medication Sig Dispense Auth. Provider   sucralfate (CARAFATE) 1 GM/10ML suspension Take 10 mLs (1 g total) by mouth 3 (three) times daily as needed.  200 mL Hazel Sams, PA-C      PDMP  not reviewed this encounter.   Hazel Sams, PA-C 04/20/21 [phone]

## 2021-04-20 NOTE — Discharge Instructions (Addendum)
-  Sucralfate up to 3x daily as needed -Follow-up with PCP for additional workup if symptoms persist.  -Head to ED if new symptoms like chest pain, palpitations, shortness of breath, etc.

## 2021-06-15 ENCOUNTER — Other Ambulatory Visit: Payer: Self-pay | Admitting: Obstetrics & Gynecology

## 2021-06-15 DIAGNOSIS — Z1231 Encounter for screening mammogram for malignant neoplasm of breast: Secondary | ICD-10-CM

## 2021-10-26 ENCOUNTER — Ambulatory Visit (INDEPENDENT_AMBULATORY_CARE_PROVIDER_SITE_OTHER): Payer: Self-pay | Admitting: Obstetrics & Gynecology

## 2021-10-26 ENCOUNTER — Encounter: Payer: Self-pay | Admitting: Obstetrics & Gynecology

## 2021-10-26 VITALS — BP 110/74

## 2021-10-26 DIAGNOSIS — N898 Other specified noninflammatory disorders of vagina: Secondary | ICD-10-CM

## 2021-10-26 DIAGNOSIS — Z113 Encounter for screening for infections with a predominantly sexual mode of transmission: Secondary | ICD-10-CM

## 2021-10-26 DIAGNOSIS — N943 Premenstrual tension syndrome: Secondary | ICD-10-CM

## 2021-10-26 LAB — WET PREP FOR TRICH, YEAST, CLUE

## 2021-10-26 MED ORDER — NORETHINDRONE 0.35 MG PO TABS: 1.0000 | ORAL_TABLET | Freq: Every day | ORAL | 4 refills | Status: DC

## 2021-10-26 NOTE — Progress Notes (Signed)
    Debbie Campos 1980/07/25 [id number]        41 y.o.  G0 Married   RP: Vaginal discharge with itching and pain with IC  HPI: Vaginal itching, discharge intermittently.  Feels dryness and irritation with IC.  Not using any lubricant.  Symptoms are worse in the premenstrual week.  Menses regular normal every month.  No BTB. No pelvic pain. No fever. Not on any contraception given h/o Infertility.   OB History  Gravida Para Term Preterm AB Living  0            SAB IAB Ectopic Multiple Live Births               Past medical history,surgical history, problem list, medications, allergies, family history and social history were all reviewed and documented in the EPIC chart.   Directed ROS with pertinent positives and negatives documented in the history of present illness/assessment and plan.  Exam:  Vitals:   10/26/21 0900  BP: 110/74   General appearance:  Normal  Abdomen: Normal  Gynecologic exam: Vulva normal.  Speculum:  Cervix normal.  Gono-Chlam.  Vaginal d/c.  Wet prep done.  Wet prep: Negative   Assessment/Plan:  41 y.o. G0P0   1. Vaginal discharge Vaginal itching, discharge intermittently.  Feels dryness and irritation with IC.  Not using any lubricant.  Symptoms are worse in the premenstrual week.  Menses regular normal every month.  No BTB. No pelvic pain. No fever. Not on any contraception given h/o Infertility.  Gyn exam normal today and Wet prep Neg.  Will start on the Progestin pill.  Recommend Probiotics 1 tab PO daily.  Suggest Coconut oil with IC. - WET PREP FOR [place], YEAST, CLUE  2. Screening for STDs (sexually transmitted diseases) Will use condoms - C. trachomatis/N. gonorrhoeae RNA  3. Premenstrual syndrome Worse vaginal symptoms with recurrent yeast vaginitis and superficial dyspareunia during the Premenstrual period.  Decision to block the cycle with the Progestin-only pill.  No CI.  Usage reviewed and prescription sent to  pharmacy.  Other orders - B Complex Vitamins (B COMPLEX PO); Take by mouth as needed. - norethindrone (CAMILA) 0.35 MG tablet; Take 1 tablet (0.35 mg total) by mouth daily.   Princess Bruins MD, 9:19 AM 10/26/2021

## 2021-10-27 LAB — C. TRACHOMATIS/N. GONORRHOEAE RNA
C. trachomatis RNA, TMA: NOT DETECTED
N. gonorrhoeae RNA, TMA: NOT DETECTED

## 2021-11-09 ENCOUNTER — Ambulatory Visit
Admission: RE | Admit: 2021-11-09 | Discharge: 2021-11-09 | Disposition: A | Discharge status: Home / Self Care | Payer: No Typology Code available for payment source | Source: Ambulatory Visit | Attending: Obstetrics & Gynecology | Admitting: Obstetrics & Gynecology

## 2021-11-09 DIAGNOSIS — Z1231 Encounter for screening mammogram for malignant neoplasm of breast: Secondary | ICD-10-CM

## 2022-02-07 ENCOUNTER — Ambulatory Visit: Payer: No Typology Code available for payment source

## 2022-02-07 DIAGNOSIS — Z23 Encounter for immunization: Secondary | ICD-10-CM

## 2022-02-24 IMAGING — MG MM DIGITAL SCREENING BILAT W/ TOMO AND CAD
8 series · 9 of 24 positions shown · non-contrast
Comparison: None

CLINICAL DATA: Screening.

EXAM:
DIGITAL SCREENING BILATERAL MAMMOGRAM WITH TOMOSYNTHESIS AND CAD
TECHNIQUE: Bilateral screening digital craniocaudal and mediolateral oblique
mammograms were obtained. Bilateral screening digital breast
tomosynthesis was performed. The images were evaluated with
computer-aided detection.

[L MLO synth-2D]
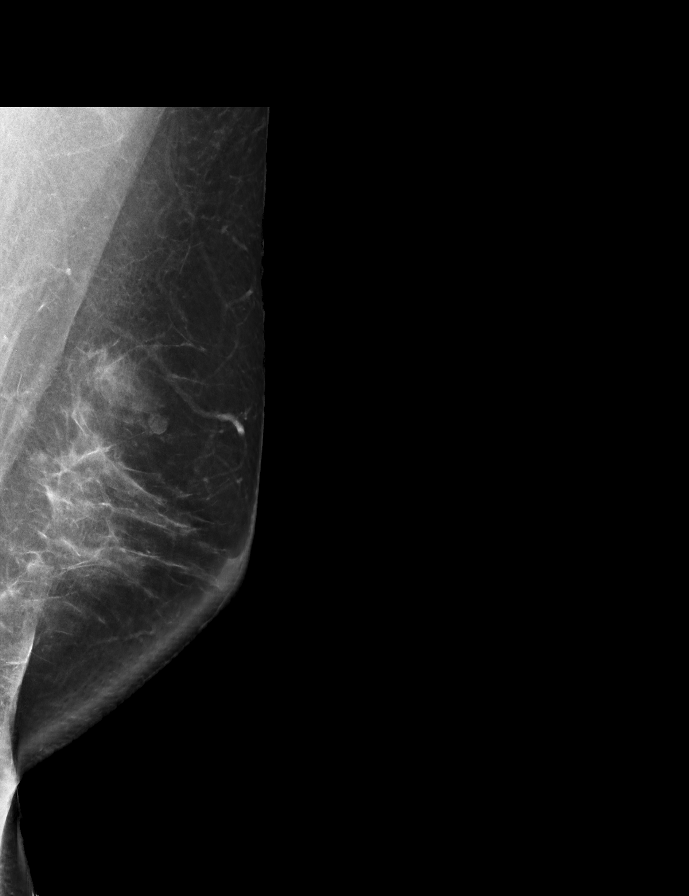

[R CC synth-2D]
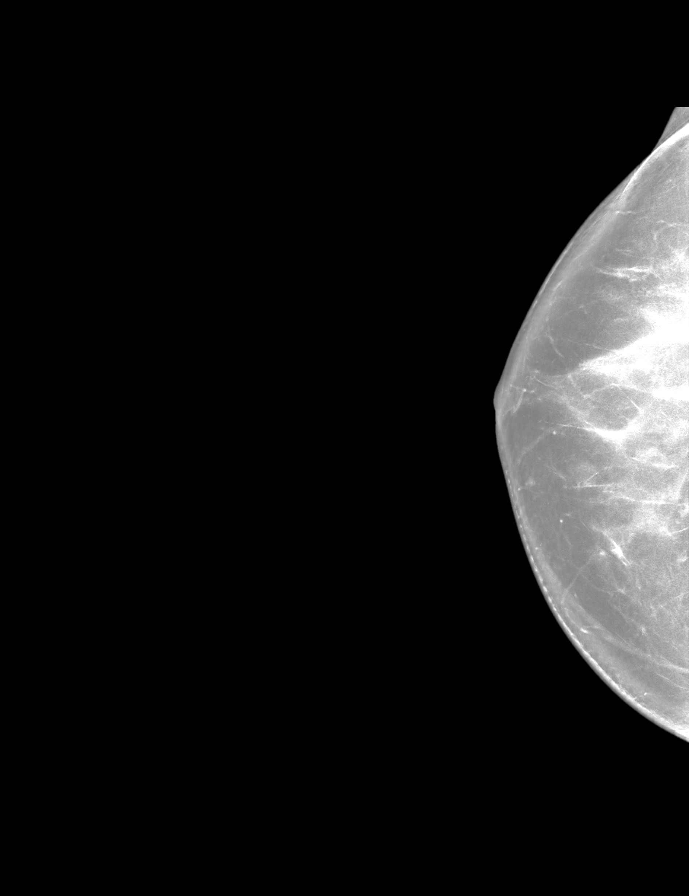

[L CC synth-2D]
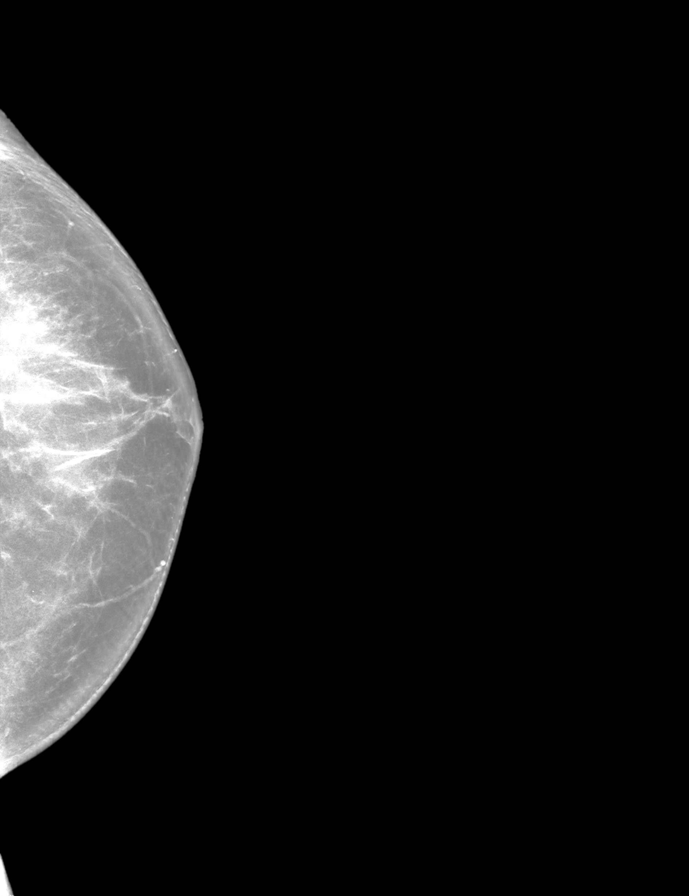

[R MLO synth-2D]
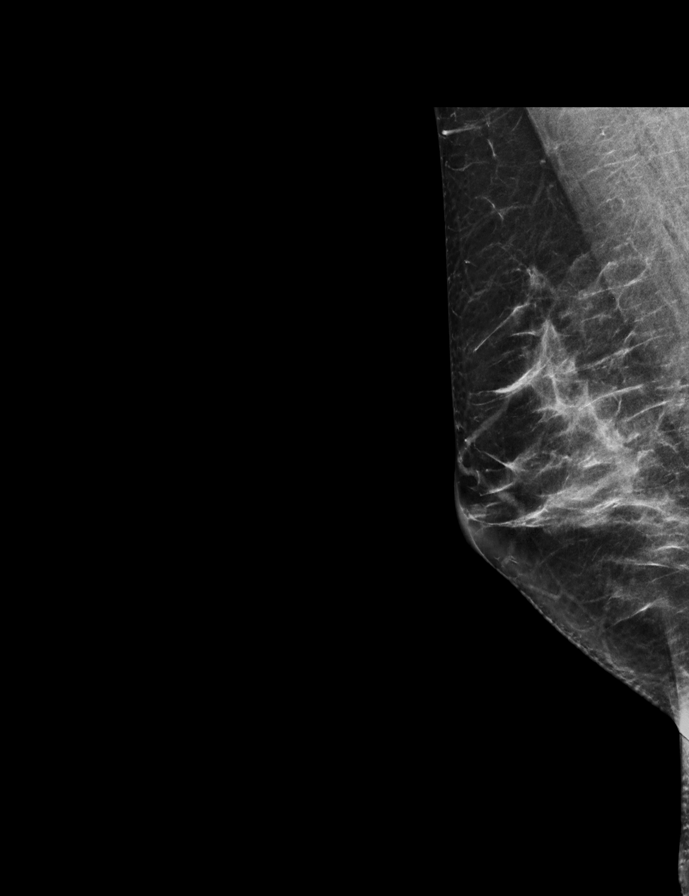

[L MLO tomo · 2 of 93 frames shown]
[frame 30/93]
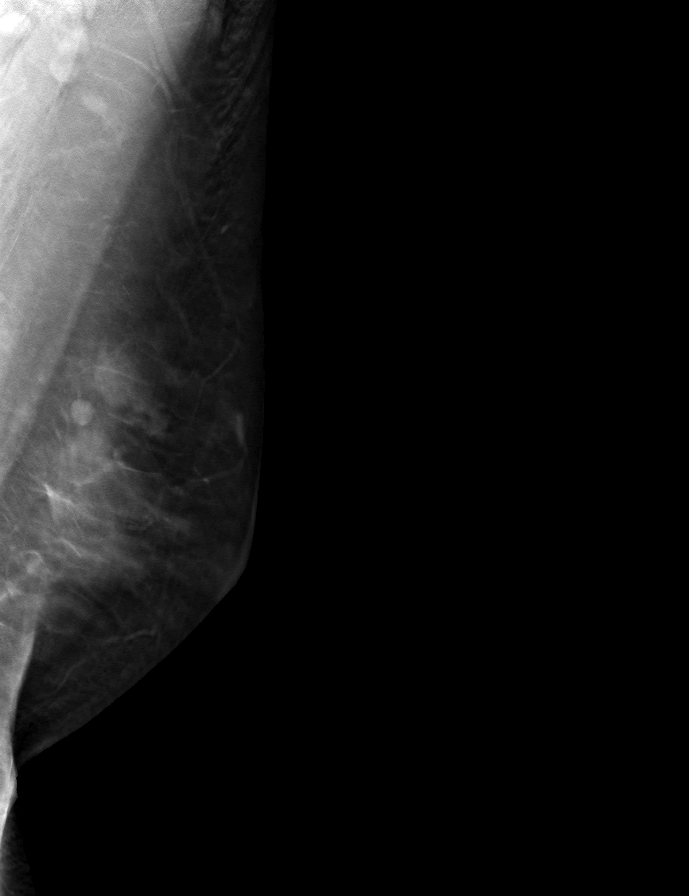
[frame 47/93]
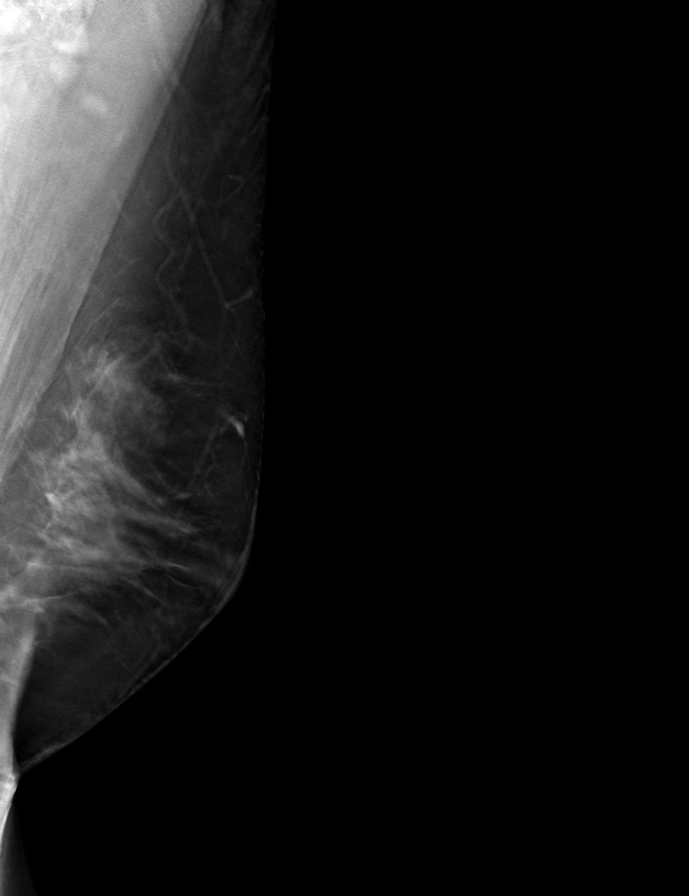

[R MLO tomo · tomo slice 33/65.0]
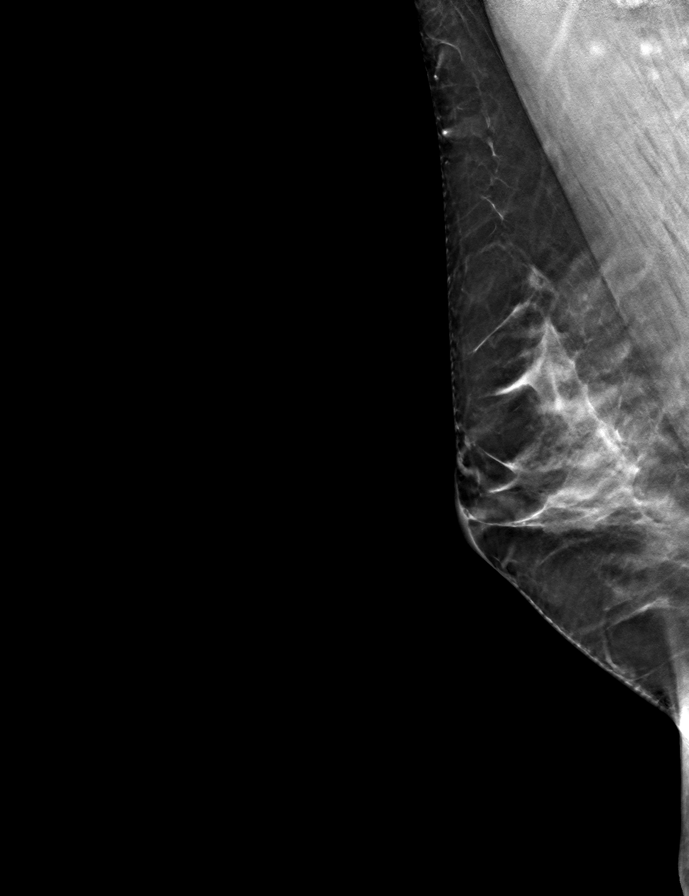

[R CC tomo · tomo slice 37/73.0]
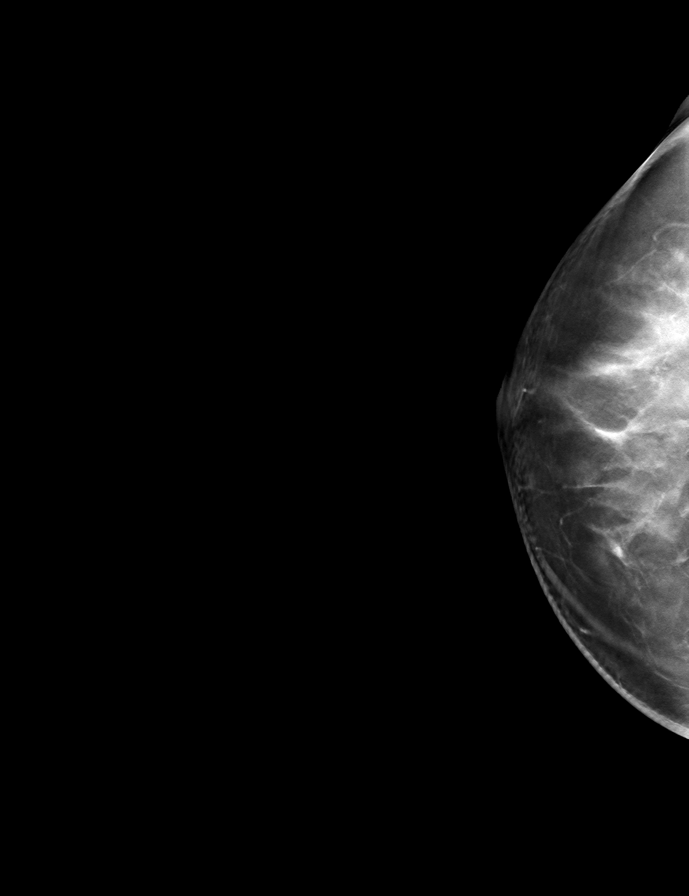

[L CC tomo · tomo slice 37/72.0]
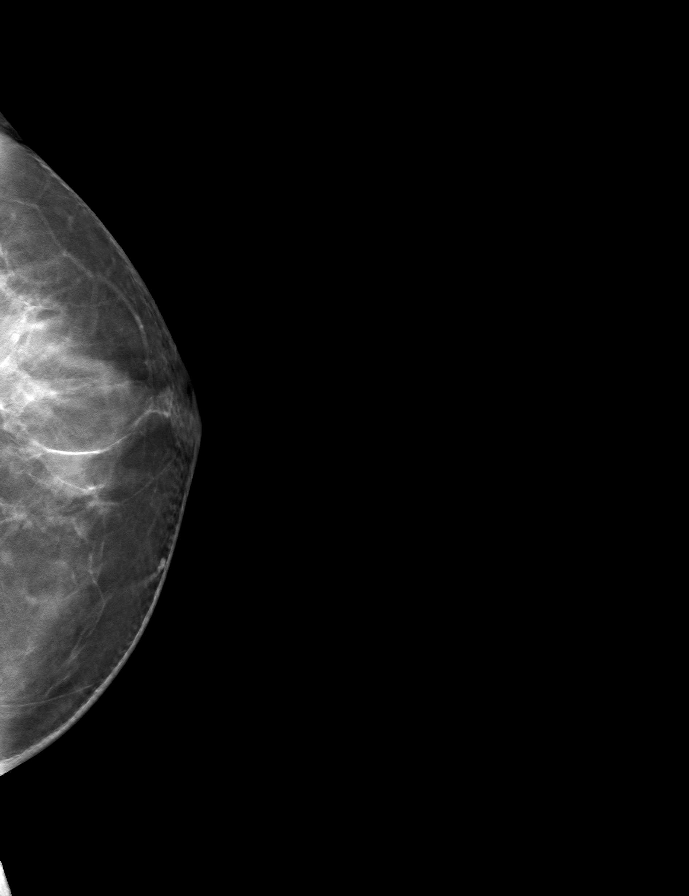

[9 of 24 positions shown; findings below may reference images not displayed]

ACR Breast Density Category c: The breast tissue is heterogeneously
dense, which may obscure small masses.
FINDINGS: In the left breast, possible asymmetries warrants further
evaluation. In the right breast, no findings suspicious for
malignancy.
IMPRESSION: Further evaluation is suggested for possible asymmetries in the left
breast.

RECOMMENDATION:
Diagnostic mammogram and possibly ultrasound of the left breast.
(Code:2M-[id number])

The patient will be contacted regarding the findings, and additional
imaging will be scheduled.

BI-RADS CATEGORY  0: Incomplete. Need additional imaging evaluation
and/or prior mammograms for comparison.

## 2022-05-02 DIAGNOSIS — Z139 Encounter for screening, unspecified: Secondary | ICD-10-CM

## 2022-05-02 LAB — GLUCOSE, POCT (MANUAL RESULT ENTRY): POC Glucose: 91 mg/dl (ref 70–99)

## 2022-05-02 NOTE — Congregational Nurse Program (Signed)
  Dept: [phone]   Congregational Nurse Program Note  Date of Encounter: 05/02/2022 BP 131/86 (BP Location: Left Arm)   Pulse 86  Pt to clinic to check on bp and cbg. Pt feeling good but had flu for a couple weeks and says bp was a little elevated at that time.   Estanislado Pandy, RN, CCNP   Past Medical History: Past Medical History:  Diagnosis Date   Bell's palsy 2011   Told it was due to stress, spontaneously resolved   Environmental allergies    Infertility, female     Encounter Details:  CNP Questionnaire - 05/02/22 1708       Questionnaire   Ask client: Do you give verbal consent for me to treat you today? Yes    Student Assistance N/A    Location Patient [place]    Visit Setting with Client Organization    Patient Status Migrant    Insurance Unknown    Insurance/Financial Assistance Referral N/A    Medical Provider Yes    Medical Referrals Made N/A    Medical Appointment Made N/A    Recently w/o PCP, now 1st time PCP visit completed due to CNs referral or appointment made N/A    Food N/A    Transportation N/A    Housing/Utilities N/A    Interpersonal Safety N/A    Interventions Tax inspector;Advocate/Support    Abnormal to Normal Screening Since Last CN Visit N/A    Screenings CN Performed Blood Pressure;Blood Glucose    Sent Client to Lab for: N/A    ED Visit Averted N/A    Life-Saving Intervention Made N/A

## 2022-06-26 ENCOUNTER — Encounter: Payer: Self-pay | Admitting: Emergency Medicine

## 2022-06-26 ENCOUNTER — Ambulatory Visit (INDEPENDENT_AMBULATORY_CARE_PROVIDER_SITE_OTHER): Payer: Self-pay | Admitting: Emergency Medicine

## 2022-06-26 VITALS — BP 120/74 | HR 82 | Temp 98.2°F | Ht 65.0 in | Wt 142.2 lb

## 2022-06-26 DIAGNOSIS — Z1329 Encounter for screening for other suspected endocrine disorder: Secondary | ICD-10-CM

## 2022-06-26 DIAGNOSIS — Z130 Encounter for screening for diseases of the blood and blood-forming organs and certain disorders involving the immune mechanism: Secondary | ICD-10-CM

## 2022-06-26 DIAGNOSIS — Z114 Encounter for screening for human immunodeficiency virus [HIV]: Secondary | ICD-10-CM

## 2022-06-26 DIAGNOSIS — Z1159 Encounter for screening for other viral diseases: Secondary | ICD-10-CM

## 2022-06-26 DIAGNOSIS — Z0000 Encounter for general adult medical examination without abnormal findings: Secondary | ICD-10-CM

## 2022-06-26 DIAGNOSIS — Z13220 Encounter for screening for lipoid disorders: Secondary | ICD-10-CM

## 2022-06-26 DIAGNOSIS — Z13228 Encounter for screening for other metabolic disorders: Secondary | ICD-10-CM

## 2022-06-26 LAB — COMPREHENSIVE METABOLIC PANEL
ALT: 16 U/L (ref 0–35)
AST: 17 U/L (ref 0–37)
Albumin: 4.3 g/dL (ref 3.5–5.2)
Alkaline Phosphatase: 70 U/L (ref 39–117)
BUN: 13 mg/dL (ref 6–23)
CO2: 25 mEq/L (ref 19–32)
Calcium: 9.4 mg/dL (ref 8.4–10.5)
Chloride: 106 mEq/L (ref 96–112)
Creatinine, Ser: 0.6 mg/dL (ref 0.40–1.20)
GFR: 109.71 mL/min (ref >60.00)
Glucose, Bld: 86 mg/dL (ref 70–99)
Potassium: 3.8 mEq/L (ref 3.5–5.1)
Sodium: 139 mEq/L (ref 135–145)
Total Bilirubin: 0.4 mg/dL (ref 0.2–1.2)
Total Protein: 7.3 g/dL (ref 6.0–8.3)

## 2022-06-26 LAB — CBC WITH DIFFERENTIAL/PLATELET
Basophils Absolute: 0 10*3/uL (ref 0.0–0.1)
Basophils Relative: 0.7 % (ref 0.0–3.0)
Eosinophils Absolute: 0.2 10*3/uL (ref 0.0–0.7)
Eosinophils Relative: 3.9 % (ref 0.0–5.0)
HCT: 37.5 % (ref 36.0–46.0)
Hemoglobin: 12.9 g/dL (ref 12.0–15.0)
Lymphocytes Relative: 36.6 % (ref 12.0–46.0)
Lymphs Abs: 1.6 10*3/uL (ref 0.7–4.0)
MCHC: 34.3 g/dL (ref 30.0–36.0)
MCV: 83.7 fl (ref 78.0–100.0)
Monocytes Absolute: 0.3 10*3/uL (ref 0.1–1.0)
Monocytes Relative: 8 % (ref 3.0–12.0)
Neutro Abs: 2.2 10*3/uL (ref 1.4–7.7)
Neutrophils Relative %: 50.8 % (ref 43.0–77.0)
Platelets: 281 10*3/uL (ref 150.0–400.0)
RBC: 4.48 Mil/uL (ref 3.87–5.11)
RDW: 14.2 % (ref 11.5–15.5)
WBC: 4.3 10*3/uL (ref 4.0–10.5)

## 2022-06-26 LAB — LIPID PANEL
Cholesterol: 147 mg/dL (ref 0–200)
HDL: 34.3 mg/dL — ABNORMAL LOW (ref >39.00)
LDL Cholesterol: 75 mg/dL (ref 0–99)
NonHDL: 112.73
Total CHOL/HDL Ratio: 4
Triglycerides: 191 mg/dL — ABNORMAL HIGH (ref 0.0–149.0)
VLDL: 38.2 mg/dL (ref 0.0–40.0)

## 2022-06-26 LAB — TSH: TSH: 1.58 u[IU]/mL (ref 0.35–5.50)

## 2022-06-26 LAB — HEMOGLOBIN A1C: Hgb A1c MFr Bld: 5.8 % (ref 4.6–6.5)

## 2022-06-26 NOTE — Patient Instructions (Signed)

## 2022-06-26 NOTE — Progress Notes (Signed)
Debbie Campos 42 y.o.   Chief Complaint  Patient presents with   New Patient (Initial Visit)    No concerns    HISTORY OF PRESENT ILLNESS: This is a 42 y.o. female first visit to this office, here to establish care with me. No chronic medical problems. Healthy female with a healthy lifestyle. Requesting annual exam.  HPI   Prior to Admission medications   Medication Sig Start Date End Date Taking? Authorizing Provider  B Complex Vitamins (B COMPLEX PO) Take by mouth as needed.   Yes [provider]  norethindrone (CAMILA) 0.35 MG tablet Take 1 tablet (0.35 mg total) by mouth daily. 10/26/21  Yes Princess Bruins, MD    No Known Allergies  There are no problems to display for this patient.   Past Medical History:  Diagnosis Date   Bell's palsy 2011   Told it was due to stress, spontaneously resolved   Environmental allergies    Infertility, female     Past Surgical History:  Procedure Laterality Date   none      Social History   Socioeconomic History   Marital status: Single    Spouse name: Not on file   Number of children: Not on file   Years of education: Not on file   Highest education level: Not on file  Occupational History   Not on file  Tobacco Use   Smoking status: Never   Smokeless tobacco: Never  Vaping Use   Vaping Use: Never used  Substance and Sexual Activity   Alcohol use: No   Drug use: No   Sexual activity: Yes    Partners: Male    Birth control/protection: None    Comment: 1ST INTERCOURSE- 13, PARTNERS- 2  Other Topics Concern   Not on file  Social History Narrative   Works part time   Programmer, 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  Negative for chills and fever.  HENT: Negative.  Negative for congestion and sore throat.   Respiratory: Negative.  Negative for cough and shortness of breath.   Cardiovascular: Negative.  Negative for chest pain and palpitations.  Gastrointestinal:  Negative for abdominal pain, diarrhea, nausea and vomiting.  Genitourinary: Negative.  Negative for dysuria and hematuria.  Skin: Negative.  Negative for rash.  Neurological: Negative.  Negative for dizziness and headaches.  All other systems reviewed and are negative.  Today's Vitals   06/26/22 0800  BP: 120/74  Pulse: 82  Temp: 98.2 F (36.8 C)  TempSrc: Oral  SpO2: 97%  Weight: 142 lb 4 oz (64.5 kg)  Height: '5\' 5"'$  (1.651 m)   Body mass index is 23.67 kg/m.   Physical Exam Vitals reviewed.  Constitutional:      Appearance: Normal appearance.  HENT:     Head: Normocephalic.     Right Ear: Tympanic membrane, ear canal and external ear normal.     Left Ear: Tympanic membrane, ear canal and external ear normal.  Mouth/Throat:     Mouth: Mucous membranes are moist.     Pharynx: Oropharynx is clear.  Eyes:     Extraocular Movements: Extraocular movements intact.     Conjunctiva/sclera: Conjunctivae normal.     Pupils: Pupils are equal, round, and reactive to light.  Cardiovascular:     Rate and Rhythm: Normal rate and regular rhythm.     Pulses: Normal pulses.     Heart sounds: Normal heart sounds.  Pulmonary:     Effort: Pulmonary effort is normal.     Breath sounds: Normal breath sounds.  Abdominal:     Palpations: Abdomen is soft.     Tenderness: There is no abdominal tenderness.  Musculoskeletal:     Cervical back: No tenderness.  Lymphadenopathy:     Cervical: No cervical adenopathy.  Skin:    General: Skin is warm and dry.  Neurological:      General: No focal deficit present.     Mental Status: She is alert and oriented to person, place, and time.  Psychiatric:        Mood and Affect: Mood normal.        Behavior: Behavior normal.      ASSESSMENT & PLAN: Problem List Items Addressed This Visit   None Visit Diagnoses     Routine general medical examination at a health care facility    -  Primary   Relevant Orders   CBC with Differential   Comprehensive metabolic panel   Hemoglobin A1c   Lipid panel   TSH   Hepatitis C antibody screen   HIV antibody   Need for hepatitis C screening test       Relevant Orders   Hepatitis C antibody screen   Screening for HIV (human immunodeficiency virus)       Relevant Orders   HIV antibody   Screening for deficiency anemia       Relevant Orders   CBC with Differential   Screening for lipoid disorders       Relevant Orders   Lipid panel   Screening for endocrine, metabolic and immunity disorder       Relevant Orders   Comprehensive metabolic panel   Hemoglobin A1c   TSH      Modifiable risk factors discussed with patient. Anticipatory guidance according to age provided. The following topics were also discussed: Social Determinants of Health Smoking.  Non-smoker Diet and nutrition.  Good eating habits Benefits of exercise.  Exercises regularly Cancer family history review Vaccinations reviewed and recommendations Cardiovascular risk assessment and need for blood work Mental health including depression and anxiety Fall and accident prevention  Patient Instructions  Mantenimiento de Technical sales engineer en [place] Maintenance, Female Adoptar un estilo de vida saludable y recibir atencin preventiva son importantes para promover la salud y Musician. Consulte al mdico sobre: El esquema adecuado para hacerse pruebas y exmenes peridicos. Cosas que puede hacer por su cuenta para prevenir enfermedades y [place] sano. Qu debo saber sobre la dieta, el peso y  el ejercicio? Consuma una dieta saludable  Consuma una dieta que incluya muchas verduras, frutas, productos lcteos con bajo contenido de [place] y Advertising account planner. No consuma muchos alimentos ricos en grasas slidas, azcares agregados o sodio. Mantenga un peso saludable El ndice de masa muscular [facility]) se [place] para identificar problemas de [place]. Proporciona una estimacin de la grasa corporal basndose en el peso y la altura. Su mdico puede ayudarle a Radiation protection practitioner [place] y a Scientist, forensic  o mantener un peso saludable. Haga ejercicio con regularidad Haga ejercicio con regularidad. Esta es una de las prcticas ms importantes que puede hacer por su salud. La [employer] de los adultos deben seguir estas pautas: Optometrist, al menos, 150 minutos de actividad fsica por semana. El ejercicio debe aumentar la frecuencia cardaca y Nature conservation officer transpirar (ejercicio de intensidad moderada). Hacer ejercicios de fortalecimiento por lo [employer] por semana. Agregue esto a su plan de ejercicio de intensidad moderada. Pase menos tiempo sentada. Incluso la actividad fsica ligera puede ser beneficiosa. Controle sus niveles de colesterol y lpidos en la sangre Comience a realizarse anlisis de lpidos y Research officer, trade union en la sangre a los 58 aos y luego reptalos cada 5 aos. Hgase controlar los niveles de colesterol con mayor frecuencia si: Sus niveles de lpidos y colesterol son altos. Es mayor de 40 aos. Presenta un alto riesgo de padecer enfermedades cardacas. Qu debo saber sobre las pruebas de deteccin del cncer? Segn su historia clnica y sus antecedentes familiares, es posible que deba realizarse pruebas de deteccin del cncer en diferentes edades. Esto puede incluir pruebas de deteccin de lo siguiente: Cncer de mama. Cncer de cuello uterino. Cncer colorrectal. Cncer de piel. Cncer de pulmn. Qu debo saber sobre la enfermedad cardaca, la diabetes y la hipertensin arterial? Presin arterial y  enfermedad cardaca La hipertensin arterial causa enfermedades cardacas y [place] el riesgo de accidente cerebrovascular. Es ms probable que esto se manifieste en las personas que tienen lecturas de presin arterial alta o tienen sobrepeso. Hgase controlar la presin arterial: Cada 3 a 5 aos si tiene entre 18 y 40 aos. Todos los aos si es mayor de 40 aos. Diabetes Realcese exmenes de deteccin de la diabetes con regularidad. Este anlisis revisa el nivel de azcar en la sangre en [place]. Hgase las pruebas de deteccin: Cada tres aos despus de los 51 aos de edad si tiene un peso normal y un bajo riesgo de padecer diabetes. Con ms frecuencia y a partir de [place] edad inferior si tiene sobrepeso o un alto riesgo de padecer diabetes. Qu debo saber sobre la prevencin de infecciones? Hepatitis B Si tiene un riesgo ms alto de contraer hepatitis B, debe someterse a un examen de deteccin de este virus. Hable con el mdico para averiguar si tiene riesgo de contraer la infeccin por hepatitis B. Hepatitis C Se recomienda el anlisis a: [employer] 1945 y 1965. Todas las personas que tengan un riesgo de haber contrado hepatitis C. Enfermedades de transmisin sexual (ETS) Hgase las pruebas de Programme researcher, broadcasting/film/video de ITS, incluidas la gonorrea y la clamidia, si: Es sexualmente activa y es menor de 23 aos. Es mayor de 44 aos, y Investment banker, operational informa que corre riesgo de tener este tipo de infecciones. La actividad sexual ha cambiado desde que le hicieron la ltima prueba de deteccin y tiene un riesgo mayor de Best boy clamidia o Radio broadcast assistant. Pregntele al mdico si usted tiene riesgo. Pregntele al mdico si usted tiene un alto riesgo de Museum/gallery curator VIH. El mdico tambin puede recomendarle un medicamento recetado para ayudar a evitar la infeccin por el VIH. Si elige tomar medicamentos para prevenir el VIH, primero debe [employer] de deteccin del VIH. Luego debe hacerse anlisis  cada 3 meses mientras est tomando los medicamentos. Embarazo Si est por dejar de Librarian, academic (fase premenopusica) y usted puede quedar [place], busque asesoramiento antes de [place]. Tome de 400 a 800 microgramos (mcg) de cido [employer]  si queda embarazada. Pida mtodos de control de la natalidad (anticonceptivos) si desea evitar un embarazo no deseado. Osteoporosis y [place] La osteoporosis es una enfermedad en la que los huesos pierden los minerales y la fuerza por el avance de la edad. El resultado pueden ser fracturas en los [place]. Si tiene 58 aos o ms, o si est en riesgo de sufrir osteoporosis y fracturas, pregunte a su mdico si debe: Hacerse pruebas de deteccin de prdida sea. Tomar un suplemento de calcio o de vitamina D para reducir el riesgo de fracturas. Recibir terapia de reemplazo hormonal (TRH) para tratar los sntomas de la menopausia. Siga estas indicaciones en su casa: Consumo de alcohol No beba alcohol si: Su mdico le indica no hacerlo. Est embarazada, puede estar embarazada o est tratando de [place]. Si bebe alcohol: Limite la cantidad que bebe a lo siguiente: De 0 a 1 bebida por da. Sepa cunta cantidad de alcohol hay en las bebidas que toma. En los [place], una medida equivale a una botella de cerveza de 12 oz (355 ml), un vaso de vino de 5 oz (148 ml) o un vaso de una bebida alcohlica de alta graduacin de 1 oz (44 ml). Estilo de vida No consuma ningn producto que contenga nicotina o tabaco. Estos productos incluyen cigarrillos, tabaco para Higher education careers adviser y aparatos de vapeo, como los Psychologist, sport and exercise. Si necesita ayuda para dejar de consumir estos productos, consulte al mdico. No consuma drogas. No comparta agujas. Solicite ayuda a su mdico si necesita apoyo o informacin para abandonar las drogas. Indicaciones generales Realcese los estudios de rutina de la salud, dentales y de Public librarian. [place]. Infrmele a su mdico si: Se siente deprimida con frecuencia. Alguna vez ha sido vctima de Council o no se siente seguro en su casa. Resumen Adoptar un estilo de vida saludable y recibir atencin preventiva son importantes para promover la salud y Musician. Siga las instrucciones del mdico acerca de una dieta saludable, el ejercicio y la realizacin de pruebas o exmenes para Engineer, building services. Siga las instrucciones del mdico con respecto al control del colesterol y la presin arterial. Esta informacin no tiene Marine scientist el consejo del mdico. Asegrese de hacerle al mdico cualquier pregunta que tenga. Document Revised: 09/22/2020 Document Reviewed: 09/22/2020 Elsevier Patient Education  [place], MD [facility]

## 2022-06-27 LAB — HEPATITIS C ANTIBODY: Hepatitis C Ab: NONREACTIVE

## 2022-06-27 LAB — HIV ANTIBODY (ROUTINE TESTING W REFLEX): HIV 1&2 Ab, 4th Generation: NONREACTIVE

## 2022-06-29 ENCOUNTER — Telehealth: Payer: Self-pay | Admitting: General Practice

## 2022-06-29 NOTE — Telephone Encounter (Signed)
Called patient and informed her of her lab results

## 2022-06-29 NOTE — Telephone Encounter (Signed)
Pt return call for to go over her lab results.

## 2022-07-03 NOTE — Telephone Encounter (Signed)
Patient states she missed the call about her results and would like a call back regarding them.

## 2022-07-03 NOTE — Telephone Encounter (Signed)
Called patient and informed her of her lab results

## 2022-07-04 DIAGNOSIS — Z139 Encounter for screening, unspecified: Secondary | ICD-10-CM

## 2022-07-04 LAB — GLUCOSE, POCT (MANUAL RESULT ENTRY): POC Glucose: 112 mg/dl — AB (ref 70–99)

## 2022-07-04 NOTE — Congregational Nurse Program (Signed)
Dept: [id number]   Congregational Nurse Program Note  Date of Encounter: 07/25/22 BP 138/76 (BP Location: Left Arm)   Pulse 84  Cbg 147 Educate pt about blood glucose levels; enc pt to return next week for follow up Estanislado Pandy, RN, CCNP   Past Medical History: Past Medical History:  Diagnosis Date   Bell's palsy 2011   Told it was due to stress, spontaneously resolved   Environmental allergies    Infertility, female     Encounter Details:  CNP Questionnaire - 07/25/22 1652       Questionnaire   Ask client: Do you give verbal consent for me to treat you today? Yes    Student Assistance N/A    Location Patient [place]    Visit Setting with Client Organization    Patient Status Migrant    Insurance Unknown    Insurance/Financial Assistance Referral N/A    Medication N/A    Medical Provider Yes    Screening Referrals Made N/A    Medical Referrals Made N/A    Medical Appointment Made N/A    Recently w/o PCP, now 1st time PCP visit completed due to CNs referral or appointment made N/A    Food N/A    Transportation N/A    Housing/Utilities N/A    Interpersonal Safety N/A    Interventions Advocate/Support;Educate    Abnormal to Normal Screening Since Last CN Visit N/A    Screenings CN Performed Blood Pressure;Blood Glucose    Sent Client to Lab for: N/A    Did client attend any of the following based off CNs referral or appointments made? N/A    ED Visit Averted N/A    Life-Saving Intervention Made N/A               Dept: [phone]   Congregational Nurse Program Note  Date of Encounter: 07/04/2022 BP 133/83 (BP Location: Right Arm)   Pulse 91  Pt to [facility] for bp and cbg. Pt concerned about diabetes; reviewed her previous lab work from 26 Jun 2022 and education provided. Enc pt to return weekly for checks if concerned.  Estanislado Pandy, RN, CCNP   Past Medical History: Past Medical History:  Diagnosis Date   Bell's  palsy 2011   Told it was due to stress, spontaneously resolved   Environmental allergies    Infertility, female     Encounter Details:  CNP Questionnaire - 07/04/22 1658       Questionnaire   Ask client: Do you give verbal consent for me to treat you today? Yes    Student Assistance N/A    Location Patient [place]    Visit Setting with Client Organization    Patient Status Migrant    Insurance Unknown    Insurance/Financial Assistance Referral [facility]    Medical Provider Yes    Screening Referrals Made N/A    Medical Referrals Made N/A    Medical Appointment Made N/A    Recently w/o PCP, now 1st time PCP visit completed due to CNs referral or appointment made N/A    Food N/A    Transportation N/A    Housing/Utilities N/A    Interpersonal Safety N/A    Interventions [facility];Educate    Abnormal to Normal Screening Since Last CN Visit N/A    Screenings CN Performed Blood Pressure;Blood Glucose    Sent Client to Lab for: N/A    Did client attend any of  the following based off CNs referral or appointments made? Screening    ED Visit Averted N/A    Life-Saving Intervention Made N/A

## 2022-07-11 ENCOUNTER — Encounter: Payer: Self-pay | Admitting: Obstetrics & Gynecology

## 2022-07-11 ENCOUNTER — Ambulatory Visit (INDEPENDENT_AMBULATORY_CARE_PROVIDER_SITE_OTHER): Payer: Self-pay | Admitting: Obstetrics & Gynecology

## 2022-07-11 VITALS — BP 120/78 | HR 77

## 2022-07-11 DIAGNOSIS — N898 Other specified noninflammatory disorders of vagina: Secondary | ICD-10-CM

## 2022-07-11 DIAGNOSIS — N9089 Other specified noninflammatory disorders of vulva and perineum: Secondary | ICD-10-CM

## 2022-07-11 LAB — WET PREP FOR TRICH, YEAST, CLUE

## 2022-07-11 MED ORDER — TINIDAZOLE 500 MG PO TABS: 1000.0000 mg | ORAL_TABLET | Freq: Two times a day (BID) | ORAL | 0 refills | Status: AC

## 2022-07-11 NOTE — Progress Notes (Signed)
    Varnell Donate 1980-10-17 [id number]        42 y.o.  G0  RP: Vaginal odor increased x a week  HPI: Vaginal odor increased x a week with vaginal discharge.  No itching.  Feeling of dryness at the pubic area.  Recent change in soap.  No pelvic pain.  No fever.  Urine/BMs normal.  Declines STI screen.   OB History  Gravida Para Term Preterm AB Living  0            SAB IAB Ectopic Multiple Live Births               Past medical history,surgical history, problem list, medications, allergies, family history and social history were all reviewed and documented in the EPIC chart.   Directed ROS with pertinent positives and negatives documented in the history of present illness/assessment and plan.  Exam:  Vitals:   07/11/22 1014  BP: 120/78  Pulse: 77  SpO2: 98%   General appearance:  Normal   Gynecologic exam: Vulva and pubic area mildly irritated and dry.  Speculum:  Cervix/vagina wnl.  Increased bubbly discharge.  Wet prep done.  Wet prep:  Clue cells present with odor   Assessment/Plan:  42 y.o. G0P0   1. Vaginal odor Bacterial vaginosis confirmed by wet prep.  Will treat with Tinidazole 2 tab PO BID x 2 days.  Usage explained. - WET PREP FOR TRICH, YEAST, CLUE  2. Vulvar/Pubic irritation Recommend washing with water only.  May use Hydrocortisone 1% cream as needed.  Other orders - tinidazole (TINDAMAX) 500 MG tablet; Take 2 tablets (1,000 mg total) by mouth 2 (two) times daily for 2 days.   Princess Bruins MD, 10:20 AM 07/11/2022

## 2022-07-27 LAB — GLUCOSE, POCT (MANUAL RESULT ENTRY): POC Glucose: 147 mg/dl — AB (ref 70–99)

## 2022-10-14 IMAGING — DX DG CHEST 2V
2 series · 2 of 2 positions shown · non-contrast
Comparison: Chest x-ray dated October 30, 2010.

CLINICAL DATA: Central chest pain for the past week.

EXAM:
CHEST - 2 VIEW

[chest pa]
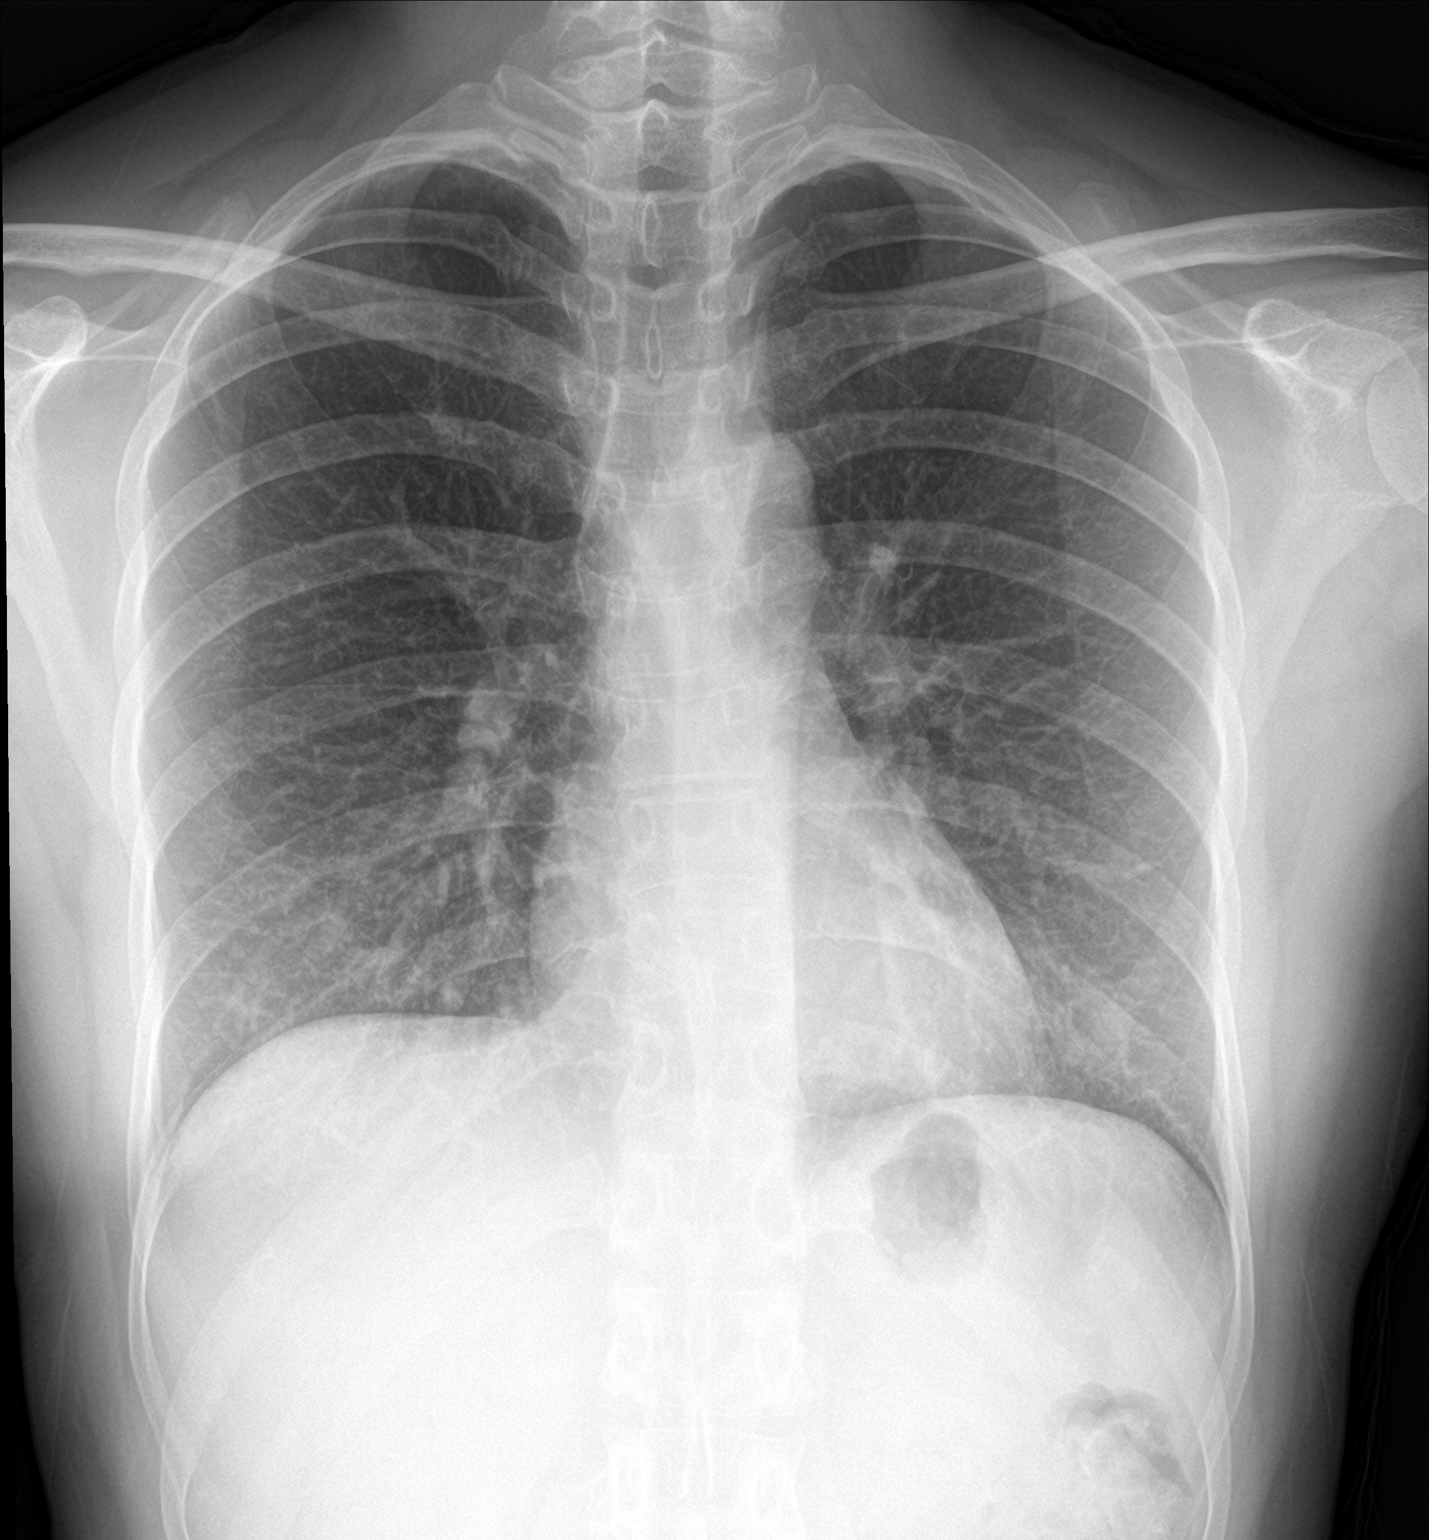

[chest lat]
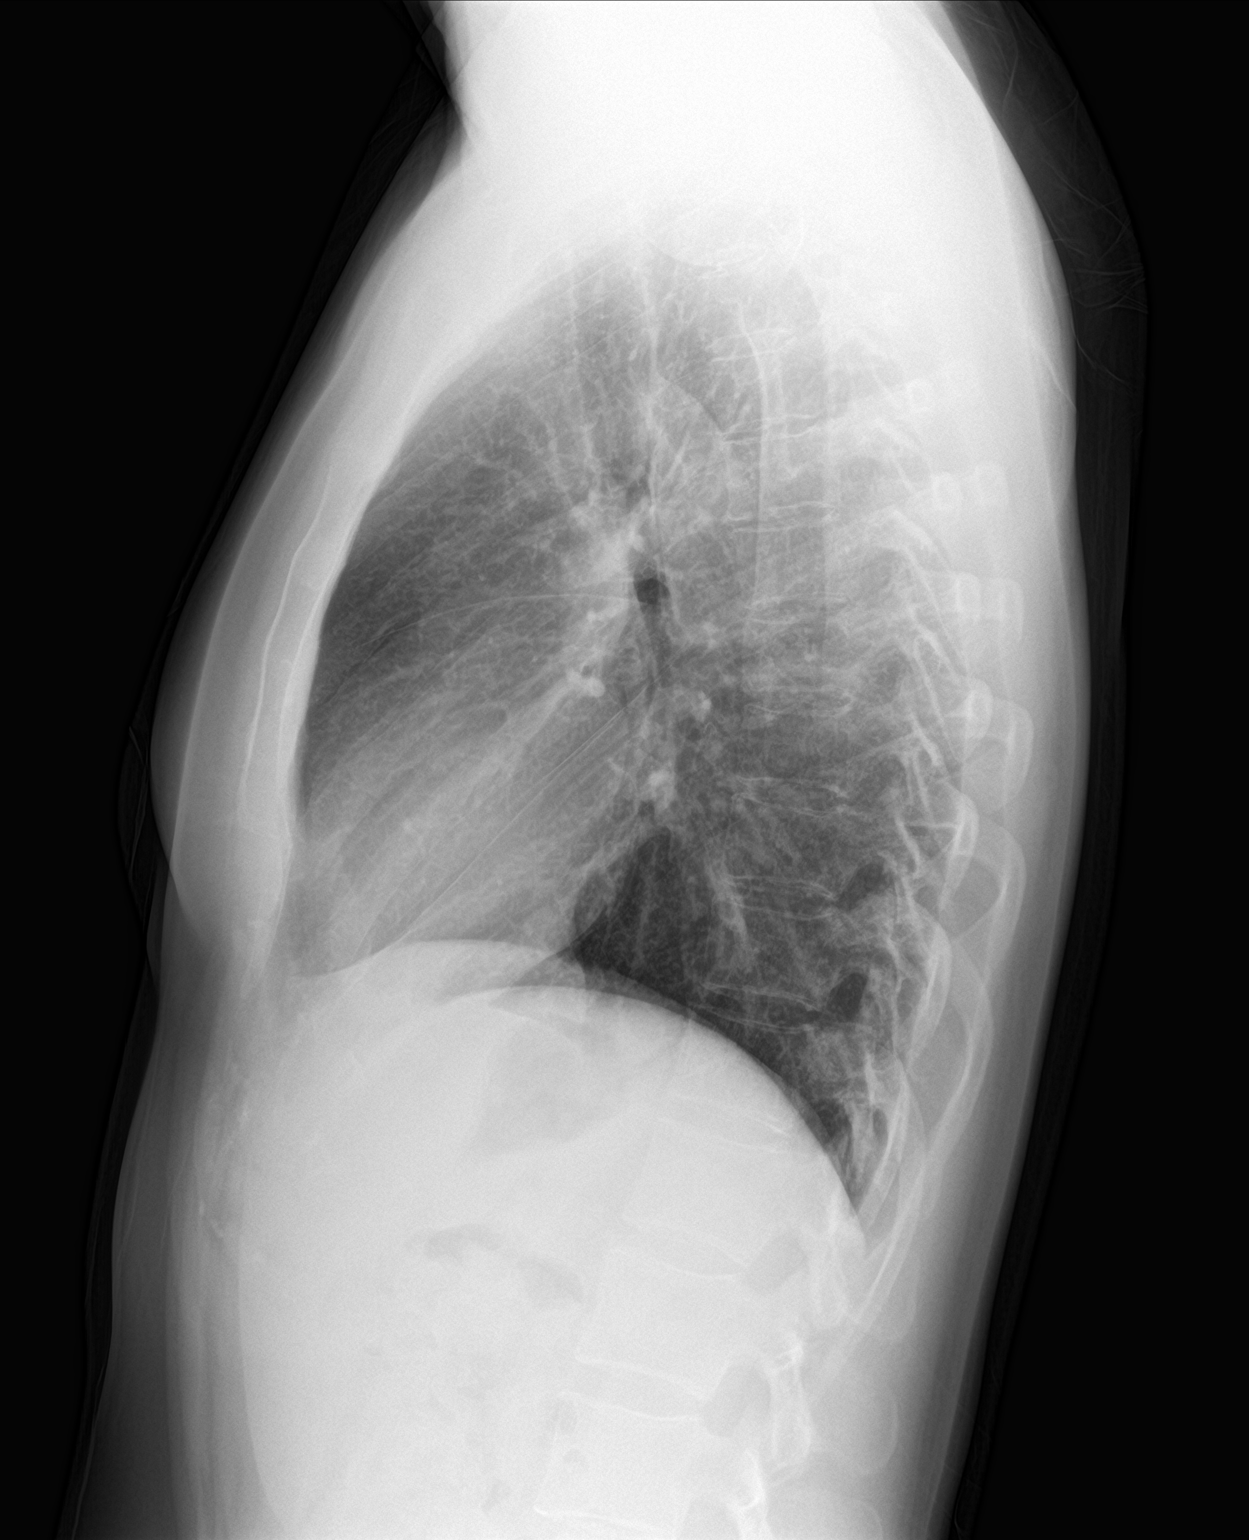

[2 of 2 positions shown; findings below may reference images not displayed]

FINDINGS: The heart size and mediastinal contours are within normal limits.
Both lungs are clear. The visualized skeletal structures are
unremarkable.
IMPRESSION: No active cardiopulmonary disease.

## 2023-09-09 ENCOUNTER — Ambulatory Visit (INDEPENDENT_AMBULATORY_CARE_PROVIDER_SITE_OTHER): Payer: Self-pay | Admitting: Obstetrics and Gynecology

## 2023-09-09 ENCOUNTER — Other Ambulatory Visit (HOSPITAL_COMMUNITY)
Admission: RE | Admit: 2023-09-09 | Discharge: 2023-09-09 | Disposition: A | Discharge status: Home / Self Care | Source: Ambulatory Visit | Attending: Obstetrics and Gynecology | Admitting: Obstetrics and Gynecology

## 2023-09-09 ENCOUNTER — Encounter: Payer: Self-pay | Admitting: Obstetrics and Gynecology

## 2023-09-09 VITALS — BP 122/78 | HR 82 | Temp 98.2°F | Ht 66.25 in | Wt 152.0 lb

## 2023-09-09 DIAGNOSIS — Z1331 Encounter for screening for depression: Secondary | ICD-10-CM

## 2023-09-09 DIAGNOSIS — Z124 Encounter for screening for malignant neoplasm of cervix: Secondary | ICD-10-CM

## 2023-09-09 DIAGNOSIS — B3731 Acute candidiasis of vulva and vagina: Secondary | ICD-10-CM

## 2023-09-09 DIAGNOSIS — Z01419 Encounter for gynecological examination (general) (routine) without abnormal findings: Secondary | ICD-10-CM | POA: Insufficient documentation

## 2023-09-09 DIAGNOSIS — N898 Other specified noninflammatory disorders of vagina: Secondary | ICD-10-CM

## 2023-09-09 LAB — WET PREP FOR TRICH, YEAST, CLUE

## 2023-09-09 MED ORDER — FLUCONAZOLE 150 MG PO TABS: 150.0000 mg | ORAL_TABLET | ORAL | 1 refills | Status: AC

## 2023-09-09 NOTE — Assessment & Plan Note (Signed)
 Cervical cancer screening performed according to ASCCP guidelines. Encouraged annual mammogram screening Colonoscopy - due next year DXA N/A Labs and immunizations with her primary Encouraged safe sexual practices as indicated Encouraged healthy lifestyle practices with diet and exercise For patients under 45yo, I recommend 1000mg  calcium daily and 600IU of vitamin D  daily.

## 2023-09-09 NOTE — Progress Notes (Signed)
 43 y.o. G0P0 female with history of infertility (clomid /IUI) here for annual exam. Single. Due to language barrier, an interpreter was present during the history-taking and subsequent discussion (and for part of the physical exam) with this patient.  Patient's last menstrual period was 08/20/2023 (approximate). Period Duration (Days): 4 Period Pattern: Regular Menstrual Flow: Moderate Menstrual Control: Maxi pad Dysmenorrhea: (!) Mild Dysmenorrhea Symptoms: Other (Comment) (breast pain,swollen, tenderness)  She reports vaginal itching, irritation with IC x 1 month.  Abnormal bleeding: none Pelvic discharge or pain: none Breast mass, nipple discharge or skin changes : none Birth control: none Last PAP: No results found for: "DIAGPAP", "HPVHIGH", "ADEQPAP" 2022 NIL Last mammogram: 11/09/21 BIRADS 1, density c Last colonoscopy: never, due next year Sexually active: yes  Exercising: no Smoker: no  Garment/textile technologist Visit from 09/09/2023 in [facility]  PHQ-2 Total Score 0       Flowsheet Row Office Visit from 02/19/2019 in Primary Care at [facility]  PHQ-9 Total Score 10       GYN HISTORY: No significant history  OB History  Gravida Para Term Preterm AB Living  0       SAB IAB Ectopic Multiple Live Births          Past Medical History:  Diagnosis Date   Bell's palsy 2011   Told it was due to stress, spontaneously resolved   Environmental allergies    Infertility, female     Past Surgical History:  Procedure Laterality Date   none      Current Outpatient Medications on File Prior to Visit  Medication Sig Dispense Refill   ofloxacin (OCUFLOX) 0.3 % ophthalmic solution INSTILL 1 DROP INTO LEFT EYE EVERY 2 HOURS FOR 2 DAYS THEN 4 TIMES DAILY FOR 1 WEEK     No current facility-administered medications on file prior to visit.    Social History   Socioeconomic History   Marital status: Single    Spouse name: Not on file    Number of children: Not on file   Years of education: Not on file   Highest education level: Not on file  Occupational History   Not on file  Tobacco Use   Smoking status: Never   Smokeless tobacco: Never  Vaping Use   Vaping status: Never Used  Substance and Sexual Activity   Alcohol use: No   Drug use: No   Sexual activity: Yes    Partners: Male    Birth control/protection: None    Comment: 1ST INTERCOURSE- 15, PARTNERS- 2  Other Topics Concern   Not on file  Social History Narrative   Works part time   Geographical information systems officer about us  from a friend   Optician, dispensing language is spanish    Social Drivers of Corporate investment banker Strain: Not on file  Food Insecurity: Not on file  Transportation Needs: Not on file  Physical Activity: Not on file  Stress: Not on file  Social Connections: Not on file  Intimate Partner Violence: Not on file    Family History  Problem Relation Age of Onset   Diabetes Paternal Grandmother    Breast cancer Neg Hx     No Known Allergies    PE Today's Vitals   09/09/23 0815  BP: 122/78  Pulse: 82  Temp: 98.2 F (36.8 C)  TempSrc: Oral  SpO2: 97%  Weight: 152 lb (68.9 kg)  Height: 5' 6.25" (1.683 m)   Body mass index is 24.35 kg/m.  Physical Exam Vitals reviewed. Exam conducted with a chaperone present.  Constitutional:      General: She is not in acute distress.    Appearance: Normal appearance.  HENT:     Head: Normocephalic and atraumatic.     Nose: Nose normal.  Eyes:     Extraocular Movements: Extraocular movements intact.     Conjunctiva/sclera: Conjunctivae normal.  Neck:     Thyroid: No thyroid mass, thyromegaly or thyroid tenderness.  Pulmonary:     Effort: Pulmonary effort is normal.  Chest:     Chest wall: No mass or tenderness.  Breasts:    Right: Normal. No swelling, mass, nipple discharge, skin change or tenderness.     Left: Normal. No swelling, mass, nipple discharge, skin change or tenderness.  Abdominal:      General: There is no distension.     Palpations: Abdomen is soft.     Tenderness: There is no abdominal tenderness.  Genitourinary:    General: Normal vulva.     Exam position: Lithotomy position.     Urethra: No prolapse.     Vagina: Normal. No vaginal discharge or bleeding.     Cervix: Normal. No lesion.     Uterus: Normal. Not enlarged and not tender.      Adnexa: Right adnexa normal and left adnexa normal.     Comments: Cervix deviated to the right Musculoskeletal:        General: Normal range of motion.     Cervical back: Normal range of motion.  Lymphadenopathy:     Upper Body:     Right upper body: No axillary adenopathy.     Left upper body: No axillary adenopathy.     Lower Body: No right inguinal adenopathy. No left inguinal adenopathy.  Skin:    General: Skin is warm and dry.  Neurological:     General: No focal deficit present.     Mental Status: She is alert.  Psychiatric:        Mood and Affect: Mood normal.        Behavior: Behavior normal.       Assessment and Plan:        Well woman exam with routine gynecological exam Assessment & Plan: Cervical cancer screening performed according to ASCCP guidelines. Encouraged annual mammogram screening Colonoscopy - due next year DXA N/A Labs and immunizations with her primary Encouraged safe sexual practices as indicated Encouraged healthy lifestyle practices with diet and exercise For patients under 50yo, I recommend 1000mg  calcium daily and 600IU of vitamin D  daily.    Cervical cancer screening -     Cytology - PAP  Negative depression screening  Vaginal discharge -     WET PREP FOR TRICH, YEAST, CLUE  Yeast vaginitis -     Fluconazole ; Take 1 tablet (150 mg total) by mouth every 3 (three) days for 3 doses.  Dispense: 3 tablet; Refill: 1    Romaine Closs, MD

## 2023-09-09 NOTE — Patient Instructions (Signed)

## 2023-09-11 ENCOUNTER — Ambulatory Visit: Payer: Self-pay | Admitting: Obstetrics and Gynecology

## 2023-09-11 LAB — CYTOLOGY - PAP
Comment: NEGATIVE
Diagnosis: NEGATIVE
High risk HPV: NEGATIVE

## 2023-12-06 ENCOUNTER — Ambulatory Visit (INDEPENDENT_AMBULATORY_CARE_PROVIDER_SITE_OTHER): Payer: Self-pay | Admitting: Obstetrics and Gynecology

## 2023-12-06 ENCOUNTER — Encounter: Payer: Self-pay | Admitting: Obstetrics and Gynecology

## 2023-12-06 ENCOUNTER — Ambulatory Visit: Payer: Self-pay | Admitting: Obstetrics and Gynecology

## 2023-12-06 VITALS — BP 120/80 | HR 79

## 2023-12-06 DIAGNOSIS — L299 Pruritus, unspecified: Secondary | ICD-10-CM

## 2023-12-06 DIAGNOSIS — Z1211 Encounter for screening for malignant neoplasm of colon: Secondary | ICD-10-CM

## 2023-12-06 DIAGNOSIS — L309 Dermatitis, unspecified: Secondary | ICD-10-CM

## 2023-12-06 DIAGNOSIS — N926 Irregular menstruation, unspecified: Secondary | ICD-10-CM

## 2023-12-06 DIAGNOSIS — Z1231 Encounter for screening mammogram for malignant neoplasm of breast: Secondary | ICD-10-CM

## 2023-12-06 LAB — WET PREP FOR TRICH, YEAST, CLUE

## 2023-12-06 LAB — PREGNANCY, URINE: Preg Test, Ur: NEGATIVE

## 2023-12-06 MED ORDER — HYDROCORTISONE 1 % EX LOTN: 1.0000 | TOPICAL_LOTION | Freq: Two times a day (BID) | CUTANEOUS | 0 refills | Status: AC

## 2023-12-06 NOTE — Progress Notes (Signed)
   Acute Office Visit  Subjective:    Patient ID: Debbie Campos, female    DOB: [birth year], 43 y.o.   MRN: [id number]   HPI 43 y.o. presents today for irregular bleeding (Irregular bleeding//jj/Cycle was 11-02-23 & had another cycle 11-27-23. She started spotting again on Tuesday. Also having external vaginal itching) .rash on mons noticed more when she has used pads and is in the line of the pad.  Wet mount today with no yeast  Patient's last menstrual period was 11/27/2023.    Review of Systems     Objective:    OBGyn Exam  BP 120/80   Pulse 79   LMP 11/27/2023   SpO2 98%  Wt Readings from Last 3 Encounters:  09/09/23 152 lb (68.9 kg)  06/26/22 142 lb 4 oz (64.5 kg)  06/24/20 139 lb (63 kg)         Component Value Date/Time   DIAGPAP  09/09/2023 0823    - Negative for intraepithelial lesion or malignancy (NILM)   HPVHIGH Negative 09/09/2023 0823   ADEQPAP  09/09/2023 0823    Satisfactory for evaluation; transformation zone component PRESENT.    High Risk HPV: Positive  Adequacy:  Satisfactory for evaluation, transformation zone component PRESENT  Diagnosis:  Atypical squamous cells of undetermined significance (ASC-US ) Denies any family history of gyn cancerns  Rash on mons and labia majora c/w likely contact dermatitis  Assessment & Plan:  DUB Contact dermatitis from likely pads  TO return for EMB and PUS to evaluate DUB further.  TO use steroid cream to the area.  Twice daily for a week. To change to hypoallergenic pads with no scents or powders. Referral placed to dermatology if not improved in a week.   Debbie Campos

## 2023-12-17 ENCOUNTER — Ambulatory Visit (INDEPENDENT_AMBULATORY_CARE_PROVIDER_SITE_OTHER): Payer: Self-pay

## 2023-12-17 ENCOUNTER — Encounter: Payer: Self-pay | Admitting: Obstetrics and Gynecology

## 2023-12-17 ENCOUNTER — Other Ambulatory Visit: Payer: Self-pay | Admitting: Obstetrics and Gynecology

## 2023-12-17 ENCOUNTER — Ambulatory Visit (INDEPENDENT_AMBULATORY_CARE_PROVIDER_SITE_OTHER): Payer: Self-pay | Admitting: Obstetrics and Gynecology

## 2023-12-17 ENCOUNTER — Other Ambulatory Visit (HOSPITAL_COMMUNITY)
Admission: RE | Admit: 2023-12-17 | Discharge: 2023-12-17 | Disposition: A | Discharge status: Home / Self Care | Payer: Self-pay | Source: Ambulatory Visit | Attending: Obstetrics and Gynecology | Admitting: Obstetrics and Gynecology

## 2023-12-17 VITALS — BP 120/70 | HR 74 | Wt 148.8 lb

## 2023-12-17 DIAGNOSIS — Z1211 Encounter for screening for malignant neoplasm of colon: Secondary | ICD-10-CM

## 2023-12-17 DIAGNOSIS — N859 Noninflammatory disorder of uterus, unspecified: Secondary | ICD-10-CM | POA: Insufficient documentation

## 2023-12-17 DIAGNOSIS — L309 Dermatitis, unspecified: Secondary | ICD-10-CM

## 2023-12-17 DIAGNOSIS — N926 Irregular menstruation, unspecified: Secondary | ICD-10-CM

## 2023-12-17 DIAGNOSIS — Z1231 Encounter for screening mammogram for malignant neoplasm of breast: Secondary | ICD-10-CM

## 2023-12-17 DIAGNOSIS — Z01812 Encounter for preprocedural laboratory examination: Secondary | ICD-10-CM

## 2023-12-17 DIAGNOSIS — D251 Intramural leiomyoma of uterus: Secondary | ICD-10-CM

## 2023-12-17 DIAGNOSIS — L299 Pruritus, unspecified: Secondary | ICD-10-CM

## 2023-12-17 LAB — PREGNANCY, URINE: Preg Test, Ur: NEGATIVE

## 2023-12-17 MED ORDER — NORETHINDRONE 0.35 MG PO TABS: 1.0000 | ORAL_TABLET | Freq: Every day | ORAL | 11 refills | Status: AC

## 2023-12-17 NOTE — Progress Notes (Signed)
   Acute Office Visit  Subjective:    Patient ID: Debbie Campos, female    DOB: Jul 12, 1980, 43 y.o.   MRN: [id number]   HPI 43 y.o. presents today for Gynecologic Exam (US  results and EMB ) .  PUS results, DUB period  on July 5th then again on the 25 th for 5 days then after 2 days had spotting again. Pain is worse on the RLQ an radiates to her lower back.  Patient's last menstrual period was 11/27/2023.    Review of Systems     Objective:    OBGyn Exam  BP 120/70   Pulse 74   Wt 148 lb 12.8 oz (67.5 kg)   LMP 11/27/2023   SpO2 98%   BMI 23.84 kg/m  Wt Readings from Last 3 Encounters:  12/17/23 148 lb 12.8 oz (67.5 kg)  09/09/23 152 lb (68.9 kg)  06/26/22 142 lb 4 oz (64.5 kg)       PROCEDURE: EMB Time out performed Consent obtained for the procedure.  A bivalve speculum was placed in the vagina.  The cervix was grasped with a single tooth tenaculum.  Pipelle was inserted and rotated. Uterus sound to 8cm. Adequate specimen was obtained and sent to pathology.  All instruments were removed.  Patient tolerated the procedure well.  To notify patient of the results.   Bernice, CMA was present for the exam  PUS 8.42cm uterus Two fibroids measuring 1.28 and 1.51cm seen EML 13mm with partial submucosal fibroid noted at fundus and avascular RO 3.1x2.4cm complex hemorrhagic cyst Left ovary with 2.54 and 1.64cm complex hemorrhagic cysts   Assessment & Plan:  Symptomatic fibroid uterus DUB Anemia Menorrhagia B/l ovarian cysts Sharp RLQ pain EMB and PUS results today  Counseled on PUS results and and all options discussed with patient.  She would like to begin micronor  today.  She struggled with infertility in the past and had 3 IUI with no success.  This is still hard to accept. Discussed RLH but cost may be a factor with self pay. May also consider depo shot q 3 months. Almarie MARLA Carpen

## 2023-12-18 ENCOUNTER — Telehealth: Payer: Self-pay | Admitting: *Deleted

## 2023-12-18 ENCOUNTER — Ambulatory Visit: Payer: Self-pay | Admitting: Obstetrics and Gynecology

## 2023-12-18 DIAGNOSIS — Z1212 Encounter for screening for malignant neoplasm of rectum: Secondary | ICD-10-CM

## 2023-12-18 DIAGNOSIS — Z1211 Encounter for screening for malignant neoplasm of colon: Secondary | ICD-10-CM

## 2023-12-18 LAB — SURGICAL PATHOLOGY

## 2023-12-18 NOTE — Telephone Encounter (Signed)
 Spoke with patient, patient declined interpreter, placed her Cousin Anna on the line with her.   Advised per Dr. Glennon. Patient is agreeable. Updated order placed. Patient is aware to call if any additional assistance is needed.   Encounter closed.

## 2023-12-18 NOTE — Telephone Encounter (Signed)
-----   Message from Almarie MARLA Carpen sent at 12/17/2023  3:22 PM EDT ----- I am sorry, can we call with the Spanish line. Since she is 68, we need to do colon cancer screening, cheapest but still good sensitivity is with [facility].  They will mail the box to her.  There is Spanish instructions.  She can also call their main number to ask for financial assistance for this. Thank you Dr. Carpen

## 2023-12-24 ENCOUNTER — Telehealth: Payer: Self-pay

## 2023-12-24 NOTE — Telephone Encounter (Signed)
-----   Message from Elsberry V sent at 12/24/2023  9:36 AM EDT ----- Regarding: Sx Consult Pt in clinic wanting a call back in regards to results from u/s.  Has more questions.

## 2023-12-24 NOTE — Telephone Encounter (Signed)
 Patient had more questions about what a fibroid is. I answered her questions.Patient started her micronor  pill & will let us  know in if it has helped with her bleeding. I used the pacific interpreter [phone]

## 2023-12-25 LAB — COLOGUARD: COLOGUARD: NEGATIVE

## 2024-01-07 ENCOUNTER — Ambulatory Visit (INDEPENDENT_AMBULATORY_CARE_PROVIDER_SITE_OTHER): Payer: Self-pay | Admitting: Physician Assistant

## 2024-01-07 ENCOUNTER — Encounter: Payer: Self-pay | Admitting: Physician Assistant

## 2024-01-07 VITALS — BP 120/81 | HR 82

## 2024-01-07 DIAGNOSIS — L309 Dermatitis, unspecified: Secondary | ICD-10-CM

## 2024-01-07 MED ORDER — TRIAMCINOLONE ACETONIDE 0.1 % EX OINT: 1.0000 | TOPICAL_OINTMENT | Freq: Two times a day (BID) | CUTANEOUS | 2 refills | Status: AC | PRN

## 2024-01-07 NOTE — Progress Notes (Signed)
   New Patient Visit   Subjective  Debbie Campos is a 43 y.o. female NEW PATIENT who presents for the following: Dryness in groin/pubic hair.   Patient states she has Dryness in groin/pubic hair that she would like to have examined. Patient reports the areas have been there for 3 months. She reports the areas are bothersome, patient reports that it is very dry and itchy.Patient rates irritation 7 out of 10. She states that the areas have not spread. Patient reports she has previously been treated for these areas. Patient reports that she has been prescribed hydrocortisone  1% lotion and it did not work. Patient denies Hx of bx.  The following portions of the chart were reviewed this encounter and updated as appropriate: medications, allergies, medical history  Review of Systems:  No other skin or systemic complaints except as noted in HPI or Assessment and Plan.  Objective  Well appearing patient in no apparent distress; mood and affect are within normal limits.   A focused examination was performed of the following areas: Face, axillae, gluteal cleft, elbows and knees.   Relevant exam findings are noted in the Assessment and Plan.    Assessment & Plan   DERMATITIS UNSPECIFIED -- FLARED (Psoriasis vs: Eczema vs: friction dermatitis)  -- Recommend: Free and Clear detergent, Vanicream soaps and start triamcinolone  ointment as directed.     DERMATITIS, UNSPECIFIED   Related Medications triamcinolone  ointment (KENALOG ) 0.1 % Apply 1 Application topically 2 (two) times daily as needed (Rash). Apply twice daily to effected area until clear  Return for TBSE next avalible opening.  I, Doyce Pan, CMA, am acting as scribe for Ziasia Lenoir K, PA-C.   Documentation: I have reviewed the above documentation for accuracy and completeness, and I agree with the above.  Iva Montelongo K, PA-C

## 2024-01-07 NOTE — Patient Instructions (Signed)

## 2024-06-04 ENCOUNTER — Ambulatory Visit (INDEPENDENT_AMBULATORY_CARE_PROVIDER_SITE_OTHER): Payer: Self-pay | Admitting: Obstetrics and Gynecology

## 2024-06-04 ENCOUNTER — Encounter: Payer: Self-pay | Admitting: Obstetrics and Gynecology

## 2024-06-04 VITALS — BP 130/80 | HR 86 | Ht 65.0 in | Wt 149.0 lb

## 2024-06-04 DIAGNOSIS — N9419 Other specified dyspareunia: Secondary | ICD-10-CM

## 2024-06-04 DIAGNOSIS — N83202 Unspecified ovarian cyst, left side: Secondary | ICD-10-CM

## 2024-06-04 DIAGNOSIS — N946 Dysmenorrhea, unspecified: Secondary | ICD-10-CM

## 2024-06-04 DIAGNOSIS — N809 Endometriosis, unspecified: Secondary | ICD-10-CM

## 2024-06-04 DIAGNOSIS — D251 Intramural leiomyoma of uterus: Secondary | ICD-10-CM

## 2024-06-04 DIAGNOSIS — N926 Irregular menstruation, unspecified: Secondary | ICD-10-CM

## 2024-06-04 DIAGNOSIS — N83201 Unspecified ovarian cyst, right side: Secondary | ICD-10-CM

## 2024-06-04 NOTE — Progress Notes (Signed)
 "  Acute Office Visit  Subjective:    Patient ID: Debbie Campos, female    DOB: April 27, 1981, 44 y.o.   MRN: [id number]   HPI 44 y.o. presents today for surgery consult Patient with history of dysmenorrhea since starting her period at the age of 23. Reports dyspareunia as well. Has started the birth control and has reduced bleeding and pain but has irregular periods like she had 1-2 years ago. She reports having bleeding for 5 days then stops and then has spotting for 2 weeks. When she lays down she has pain in her lower back that radiates to her lower pelvis. Her typical pain with her periods is all across her lower back and pelvis and down both legs. She denies any blood in in her stool but has constipation.  Her last visit she reported the pain was worse on the RLQ an radiates to her lower back. She has come to accept that she is at the age where conception is more difficult without IVF or a donor egg and costs would be a factor for her. She does report the pain and bleeding are affecting the quality of her life and she has to work with out the disruption of the pain and bleeding.  Patient's last menstrual period was 05/25/2024 (exact date). Period Cycle (Days): 28 Period Duration (Days): 5 - 6 Period Pattern: Regular Menstrual Flow: Light Menstrual Control: Maxi pad Menstrual Control Change Freq (Hours): 2 Dysmenorrhea: (!) Mild Dysmenorrhea Symptoms: Cramping  Review of Systems     Objective:    OBGyn Exam  BP 130/80 (BP Location: Left Arm, Patient Position: Sitting, Cuff Size: Normal)   Pulse 86   Ht 5' 5 (1.651 m)   Wt 149 lb (67.6 kg)   LMP 05/25/2024 (Exact Date)   SpO2 97%   BMI 24.79 kg/m  Wt Readings from Last 3 Encounters:  06/04/24 149 lb (67.6 kg)  12/17/23 148 lb 12.8 oz (67.5 kg)  09/09/23 152 lb (68.9 kg)    Surgical pathology( / [facility]) Order: [id number]  Status: Edited Result - FINAL     Next appt: None     Dx:  Disorder of endometrium; Irregular me...   Test Result Released: No (inaccessible in MyChart)   2 Result Notes     View Follow-Up Encounter    Component Ref Range & Units (hover) 5 mo ago  SURGICAL PATHOLOGY SURGICAL PATHOLOGY CASE: [id number] PATIENT: Debbie Campos Surgical Pathology Report     Clinical History: DUB, fibroids, thickened endometrial lining (cm)     FINAL MICROSCOPIC DIAGNOSIS:  A. ENDOMETRIUM, BIOPSY: - Secretory endometrium. - Negative for atypia/EIN and malignancy.  GROSS DESCRIPTION:  The specimen is received in formalin and consists of a 2.4 x 2.1 x 0.2 cm aggregate of tan-pink soft tissue and mucus.  The specimen is entirely submitted in 1 cassette.  SHIRLEEN 12/17/2023)  Final Diagnosis performed by Rexene Daily, MD.   Electronically signed 12/18/2023 Technical component performed at [facility], [address], [place], [place] [address].  Professional component performed at [facility]. [address], [place], [place] [address]  Immunohistochemistry Technical component (if applicable) was performed at [facility] [employer]. [address], [place], [place] [address].  IMMUNOHISTOCHEMISTRY DISCLAIMER (if applicable): Some of these immunohistochemical stains may have been developed and the performance characteristics determine by [facility] Some may not have been cleared or approved by the U.S. Food and Drug Administration. The FDA has  determined that such clearance or approval is not necessary. This test is used for clinical purposes. It should not be regarded as investigational or for research. This laboratory is certified under the Clinical Laboratory Improvement Amendments of 1988 (CLIA-88) as qualified to perform high complexity clinical laboratory testing.  The controls stained appropriately.      PUS 12/17/23 8.42cm uterus Two fibroids measuring  1.28 and 1.51cm seen EML 13mm with partial submucosal fibroid noted at fundus and avascular RO 3.1x2.4cm complex hemorrhagic cyst Left ovary with 2.54 and 1.64cm complex hemorrhagic cysts  OB History     Gravida  0   Para      Term      Preterm      AB      Living         SAB      IAB      Ectopic      Multiple      Live Births             Past Surgical History:  Procedure Laterality Date   none     Social History   Socioeconomic History   Marital status: Single    Spouse name: Not on file   Number of children: Not on file   Years of education: Not on file   Highest education level: Not on file  Occupational History   Not on file  Tobacco Use   Smoking status: Never   Smokeless tobacco: Never  Vaping Use   Vaping status: Never Used  Substance and Sexual Activity   Alcohol use: No   Drug use: No   Sexual activity: Yes    Partners: Male    Birth control/protection: None    Comment: 1ST INTERCOURSE- 68, PARTNERS- 2  Other Topics Concern   Not on file  Social History Narrative   Works part time   Geographical Information Systems Officer about us  from a friend   Optician, 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    Social Drivers of Health   Tobacco Use: Low Risk (06/04/2024)   Patient History    Smoking Tobacco Use: Never    Smokeless Tobacco Use: Never    Passive Exposure: Not on file  Financial Resource Strain: Not on file  Food Insecurity: Not on file  Transportation Needs: Not on file  Physical Activity: Not on file  Stress: Not on file  Social Connections: Not on file  Depression (PHQ2-9): Low Risk (09/09/2023)   Depression (PHQ2-9)    PHQ-2 Score: 0  Alcohol Screen: Not on file  Housing: Not on file  Utilities: Not on file  Health Literacy: Not on file    Assessment & Plan:  Symptomatic fibroid uterus DUB Anemia Menorrhagia B/l ovarian cysts Sharp RLQ pain B/l ovarian cysts Dysmenorrhea Dyspareunia Likely endometriosis  Reviewed all options in detail with conservative with  continued ocp use and surgical options. She would like to have the [facility] with excision of endometriosis lesions and possible bilateral ovarian cystectomy. Reviewed causes of endometriosis with two possible theories such as ceolimic and [employer]  Reviewed it is endometrial tissue that is planted outside the endometrial cavity and can be found on ligaments, ovaries, uterus, bowel, appendix, and peritoneum. Discussed how it is diagnosis by pathology from the excisional biopsies at the time of laparoscopy.  Discussed risk of recurrence and future surgeries.  Discussed symptoms such as dysmenorrhea and dyspareunia.  Discussed that it can also be found to cause infertility with scarring of the fallopian tubes. The robotic  procedure was discussed in detail with the option for peritoneal stripping and excision of lesions, that are in a safe location. If on the appendix, the appendectomy will need to be performed with general surgery, if available. She would like to have the procedure scheduled.  To send to surgery scheduling.  30 minutes spent on reviewing records, imaging,  and one on one patient time and counseling patient and documentation Dr. Glennon Almarie MARLA Glennon "
